# Patient Record
Sex: Female | Born: 1970 | Race: White | Hispanic: No | Marital: Married | Smoking: Never smoker
Health system: Southern US, Community
[De-identification: ages and names within clinical notes are randomized; demographics above are authoritative.]

## PROBLEM LIST (undated history)

## (undated) DIAGNOSIS — Z973 Presence of spectacles and contact lenses: Secondary | ICD-10-CM

## (undated) DIAGNOSIS — N920 Excessive and frequent menstruation with regular cycle: Secondary | ICD-10-CM

## (undated) DIAGNOSIS — J302 Other seasonal allergic rhinitis: Secondary | ICD-10-CM

## (undated) DIAGNOSIS — Z8669 Personal history of other diseases of the nervous system and sense organs: Secondary | ICD-10-CM

## (undated) DIAGNOSIS — Z9889 Other specified postprocedural states: Secondary | ICD-10-CM

## (undated) DIAGNOSIS — M542 Cervicalgia: Secondary | ICD-10-CM

## (undated) DIAGNOSIS — E559 Vitamin D deficiency, unspecified: Secondary | ICD-10-CM

## (undated) DIAGNOSIS — K219 Gastro-esophageal reflux disease without esophagitis: Secondary | ICD-10-CM

## (undated) DIAGNOSIS — T4145XA Adverse effect of unspecified anesthetic, initial encounter: Secondary | ICD-10-CM

## (undated) DIAGNOSIS — R609 Edema, unspecified: Secondary | ICD-10-CM

## (undated) DIAGNOSIS — F909 Attention-deficit hyperactivity disorder, unspecified type: Secondary | ICD-10-CM

## (undated) DIAGNOSIS — R112 Nausea with vomiting, unspecified: Secondary | ICD-10-CM

## (undated) DIAGNOSIS — F32A Depression, unspecified: Secondary | ICD-10-CM

## (undated) DIAGNOSIS — G8929 Other chronic pain: Secondary | ICD-10-CM

## (undated) DIAGNOSIS — F329 Major depressive disorder, single episode, unspecified: Secondary | ICD-10-CM

## (undated) DIAGNOSIS — F419 Anxiety disorder, unspecified: Secondary | ICD-10-CM

## (undated) DIAGNOSIS — T8859XA Other complications of anesthesia, initial encounter: Secondary | ICD-10-CM

## (undated) HISTORY — PX: DIAGNOSTIC LAPAROSCOPY: SUR761

---

## 1997-12-15 ENCOUNTER — Other Ambulatory Visit: Admission: RE | Admit: 1997-12-15 | Discharge: 1997-12-15 | Payer: Self-pay | Admitting: Obstetrics & Gynecology

## 1998-03-27 ENCOUNTER — Inpatient Hospital Stay (HOSPITAL_COMMUNITY): Admission: AD | Admit: 1998-03-27 | Discharge: 1998-03-27 | Payer: Self-pay | Admitting: Gynecology

## 2000-01-23 ENCOUNTER — Other Ambulatory Visit: Admission: RE | Admit: 2000-01-23 | Discharge: 2000-01-23 | Payer: Self-pay | Admitting: Gynecology

## 2000-10-29 ENCOUNTER — Other Ambulatory Visit: Admission: RE | Admit: 2000-10-29 | Discharge: 2000-10-29 | Payer: Self-pay | Admitting: Obstetrics & Gynecology

## 2001-01-19 ENCOUNTER — Inpatient Hospital Stay (HOSPITAL_COMMUNITY): Admission: AD | Admit: 2001-01-19 | Discharge: 2001-01-26 | Payer: Self-pay | Admitting: Obstetrics and Gynecology

## 2001-01-20 ENCOUNTER — Encounter: Payer: Self-pay | Admitting: Obstetrics and Gynecology

## 2001-02-05 ENCOUNTER — Inpatient Hospital Stay (HOSPITAL_COMMUNITY): Admission: AD | Admit: 2001-02-05 | Discharge: 2001-02-08 | Payer: Self-pay | Admitting: Family Medicine

## 2001-02-08 ENCOUNTER — Encounter: Payer: Self-pay | Admitting: Obstetrics & Gynecology

## 2001-03-15 ENCOUNTER — Inpatient Hospital Stay (HOSPITAL_COMMUNITY): Admission: AD | Admit: 2001-03-15 | Discharge: 2001-03-19 | Payer: Self-pay | Admitting: Obstetrics and Gynecology

## 2001-03-16 ENCOUNTER — Encounter: Payer: Self-pay | Admitting: Obstetrics and Gynecology

## 2001-04-15 ENCOUNTER — Other Ambulatory Visit: Admission: RE | Admit: 2001-04-15 | Discharge: 2001-04-15 | Payer: Self-pay | Admitting: Obstetrics and Gynecology

## 2002-05-06 ENCOUNTER — Other Ambulatory Visit: Admission: RE | Admit: 2002-05-06 | Discharge: 2002-05-06 | Payer: Self-pay | Admitting: Obstetrics and Gynecology

## 2002-10-19 ENCOUNTER — Other Ambulatory Visit: Admission: RE | Admit: 2002-10-19 | Discharge: 2002-10-19 | Payer: Self-pay | Admitting: Obstetrics and Gynecology

## 2003-11-18 ENCOUNTER — Other Ambulatory Visit: Admission: RE | Admit: 2003-11-18 | Discharge: 2003-11-18 | Payer: Self-pay | Admitting: Obstetrics and Gynecology

## 2004-12-01 ENCOUNTER — Other Ambulatory Visit: Admission: RE | Admit: 2004-12-01 | Discharge: 2004-12-01 | Payer: Self-pay | Admitting: Obstetrics and Gynecology

## 2006-01-08 ENCOUNTER — Other Ambulatory Visit: Admission: RE | Admit: 2006-01-08 | Discharge: 2006-01-08 | Payer: Self-pay | Admitting: Obstetrics and Gynecology

## 2007-02-11 ENCOUNTER — Emergency Department (HOSPITAL_COMMUNITY): Admission: EM | Admit: 2007-02-11 | Discharge: 2007-02-11 | Payer: Self-pay | Admitting: Emergency Medicine

## 2012-07-08 ENCOUNTER — Other Ambulatory Visit: Payer: Self-pay | Admitting: Obstetrics and Gynecology

## 2012-07-08 DIAGNOSIS — R928 Other abnormal and inconclusive findings on diagnostic imaging of breast: Secondary | ICD-10-CM

## 2012-07-11 ENCOUNTER — Ambulatory Visit
Admission: RE | Admit: 2012-07-11 | Discharge: 2012-07-11 | Disposition: A | Discharge status: Home / Self Care | Payer: 59 | Source: Ambulatory Visit | Attending: Obstetrics and Gynecology | Admitting: Obstetrics and Gynecology

## 2012-07-11 DIAGNOSIS — R928 Other abnormal and inconclusive findings on diagnostic imaging of breast: Secondary | ICD-10-CM

## 2013-01-26 DIAGNOSIS — M47812 Spondylosis without myelopathy or radiculopathy, cervical region: Principal | ICD-10-CM | POA: Diagnosis present

## 2013-01-27 ENCOUNTER — Encounter (HOSPITAL_COMMUNITY): Payer: Self-pay | Admitting: Pharmacy Technician

## 2013-01-29 ENCOUNTER — Encounter (HOSPITAL_COMMUNITY)
Admission: RE | Admit: 2013-01-29 | Discharge: 2013-01-29 | Disposition: A | Discharge status: Home / Self Care | Payer: 59 | Source: Ambulatory Visit | Attending: Orthopedic Surgery | Admitting: Orthopedic Surgery

## 2013-01-29 ENCOUNTER — Encounter (HOSPITAL_COMMUNITY): Payer: Self-pay

## 2013-01-29 DIAGNOSIS — M25519 Pain in unspecified shoulder: Secondary | ICD-10-CM | POA: Insufficient documentation

## 2013-01-29 DIAGNOSIS — M79609 Pain in unspecified limb: Secondary | ICD-10-CM | POA: Insufficient documentation

## 2013-01-29 DIAGNOSIS — R609 Edema, unspecified: Secondary | ICD-10-CM | POA: Insufficient documentation

## 2013-01-29 DIAGNOSIS — M5380 Other specified dorsopathies, site unspecified: Secondary | ICD-10-CM | POA: Insufficient documentation

## 2013-01-29 DIAGNOSIS — Z01818 Encounter for other preprocedural examination: Secondary | ICD-10-CM | POA: Insufficient documentation

## 2013-01-29 DIAGNOSIS — Z01812 Encounter for preprocedural laboratory examination: Secondary | ICD-10-CM | POA: Insufficient documentation

## 2013-01-29 HISTORY — DX: Anxiety disorder, unspecified: F41.9

## 2013-01-29 HISTORY — DX: Gastro-esophageal reflux disease without esophagitis: K21.9

## 2013-01-29 HISTORY — DX: Adverse effect of unspecified anesthetic, initial encounter: T41.45XA

## 2013-01-29 HISTORY — DX: Nausea with vomiting, unspecified: R11.2

## 2013-01-29 HISTORY — DX: Other specified postprocedural states: Z98.890

## 2013-01-29 HISTORY — DX: Other complications of anesthesia, initial encounter: T88.59XA

## 2013-01-29 LAB — BASIC METABOLIC PANEL
CO2: 29 mEq/L (ref 19–32)
Calcium: 9.3 mg/dL (ref 8.4–10.5)
Potassium: 4 mEq/L (ref 3.5–5.1)
Sodium: 137 mEq/L (ref 135–145)

## 2013-01-29 LAB — CBC
HCT: 36.7 % (ref 36.0–46.0)
Hemoglobin: 12.8 g/dL (ref 12.0–15.0)
MCHC: 34.9 g/dL (ref 30.0–36.0)
RBC: 4.2 MIL/uL (ref 3.87–5.11)
WBC: 6 10*3/uL (ref 4.0–10.5)

## 2013-01-29 LAB — SURGICAL PCR SCREEN
MRSA, PCR: NEGATIVE
Staphylococcus aureus: NEGATIVE

## 2013-01-29 NOTE — Pre-Procedure Instructions (Addendum)
[name]  01/29/2013   Your procedure is scheduled on:  02/04/2013  Report to [facility] at 9:30 AM.  Call this number if you have problems the morning of surgery: [phone]   Remember:  BRING BACK BRACE   Do not eat food or drink liquids after midnight.  TUESDAY   Take these medicines the morning of surgery with A SIP OF WATER:  ALLEGRA, may take pain med. Morning of surgery if needed    Do not wear jewelry, make-up or nail polish.  Do not wear lotions, powders, or perfumes. You may wear deodorant.  Do not shave 48 hours prior to surgery.   Do not bring valuables to the hospital.  Contacts, dentures or bridgework may not be worn into surgery.  Leave suitcase in the car. After surgery it may be brought to your room.  For patients admitted to the hospital, checkout time is 11:00 AM the day of  discharge.   Patients discharged the day of surgery will not be allowed to drive  home.  Name and phone number of your driver: /w spouse   Special Instructions: Shower using CHG 2 nights before surgery and the night before surgery.  If you shower the day of surgery use CHG.  Use special wash - you have one bottle of CHG for all showers.  You should use approximately 1/3 of the bottle for each shower.   Please read over the following fact sheets that you were given: Pain Booklet, Coughing and Deep Breathing, MRSA Information and Surgical Site Infection Prevention

## 2013-02-03 MED ORDER — CEFAZOLIN SODIUM-DEXTROSE 2-3 GM-% IV SOLR
2.0000 g | INTRAVENOUS | Status: AC
  Administered 2013-02-04: 2 g via INTRAVENOUS
  Filled 2013-02-03: qty 50

## 2013-02-04 ENCOUNTER — Observation Stay (HOSPITAL_COMMUNITY)
Admission: RE | Admit: 2013-02-04 | Discharge: 2013-02-05 | Disposition: A | Discharge status: Home / Self Care | Payer: 59 | Source: Ambulatory Visit | Attending: Orthopedic Surgery | Admitting: Orthopedic Surgery

## 2013-02-04 ENCOUNTER — Observation Stay (HOSPITAL_COMMUNITY): Payer: 59

## 2013-02-04 ENCOUNTER — Encounter (HOSPITAL_COMMUNITY)
Admission: RE | Disposition: A | Discharge status: Home / Self Care | Payer: Self-pay | Source: Ambulatory Visit | Attending: Orthopedic Surgery

## 2013-02-04 ENCOUNTER — Ambulatory Visit (HOSPITAL_COMMUNITY): Payer: 59 | Admitting: Anesthesiology

## 2013-02-04 ENCOUNTER — Encounter (HOSPITAL_COMMUNITY): Payer: Self-pay | Admitting: *Deleted

## 2013-02-04 ENCOUNTER — Encounter (HOSPITAL_COMMUNITY): Payer: Self-pay | Admitting: Anesthesiology

## 2013-02-04 DIAGNOSIS — M5020 Other cervical disc displacement, unspecified cervical region: Secondary | ICD-10-CM | POA: Insufficient documentation

## 2013-02-04 DIAGNOSIS — M47812 Spondylosis without myelopathy or radiculopathy, cervical region: Principal | ICD-10-CM | POA: Diagnosis present

## 2013-02-04 HISTORY — DX: Other seasonal allergic rhinitis: J30.2

## 2013-02-04 HISTORY — PX: ANTERIOR CERVICAL DECOMP/DISCECTOMY FUSION: SHX1161

## 2013-02-04 LAB — HCG, SERUM, QUALITATIVE: Preg, Serum: NEGATIVE

## 2013-02-04 SURGERY — ANTERIOR CERVICAL DECOMPRESSION/DISCECTOMY FUSION 3 LEVELS
Anesthesia: General | Site: Spine Cervical | Wound class: Clean

## 2013-02-04 MED ORDER — LIDOCAINE HCL (CARDIAC) 20 MG/ML IV SOLN
INTRAVENOUS | Status: DC | PRN
  Administered 2013-02-04: 80 mg via INTRAVENOUS

## 2013-02-04 MED ORDER — ROCURONIUM BROMIDE 100 MG/10ML IV SOLN
INTRAVENOUS | Status: DC | PRN
  Administered 2013-02-04: 50 mg via INTRAVENOUS

## 2013-02-04 MED ORDER — PHENOL 1.4 % MT LIQD
1.0000 | OROMUCOSAL | Status: DC | PRN
  Administered 2013-02-04 – 2013-02-05 (×2): 1 via OROMUCOSAL
  Filled 2013-02-04: qty 177

## 2013-02-04 MED ORDER — SODIUM CHLORIDE 0.9 % IJ SOLN: 3.0000 mL | INTRAMUSCULAR | Status: DC | PRN

## 2013-02-04 MED ORDER — LACTATED RINGERS IV SOLN
INTRAVENOUS | Status: DC
  Administered 2013-02-04: 11:00:00 via INTRAVENOUS

## 2013-02-04 MED ORDER — SODIUM CHLORIDE 0.9 % IV SOLN: 250.0000 mL | INTRAVENOUS | Status: DC

## 2013-02-04 MED ORDER — VECURONIUM BROMIDE 10 MG IV SOLR
INTRAVENOUS | Status: DC | PRN
  Administered 2013-02-04: 4 mg via INTRAVENOUS
  Administered 2013-02-04 (×3): 2 mg via INTRAVENOUS

## 2013-02-04 MED ORDER — ACETAMINOPHEN 10 MG/ML IV SOLN
INTRAVENOUS | Status: AC
  Filled 2013-02-04: qty 100

## 2013-02-04 MED ORDER — 0.9 % SODIUM CHLORIDE (POUR BTL) OPTIME
TOPICAL | Status: DC | PRN
  Administered 2013-02-04: 1000 mL

## 2013-02-04 MED ORDER — ONDANSETRON HCL 4 MG/2ML IJ SOLN
4.0000 mg | INTRAMUSCULAR | Status: DC | PRN
  Administered 2013-02-04 – 2013-02-05 (×4): 4 mg via INTRAVENOUS
  Filled 2013-02-04 (×4): qty 2

## 2013-02-04 MED ORDER — HYDROMORPHONE HCL PF 1 MG/ML IJ SOLN
INTRAMUSCULAR | Status: AC
  Filled 2013-02-04: qty 1

## 2013-02-04 MED ORDER — OXYCODONE HCL 5 MG PO TABS
ORAL_TABLET | ORAL | Status: AC
  Filled 2013-02-04: qty 2

## 2013-02-04 MED ORDER — CEFAZOLIN SODIUM 1-5 GM-% IV SOLN
1.0000 g | Freq: Three times a day (TID) | INTRAVENOUS | Status: AC
  Administered 2013-02-04 – 2013-02-05 (×2): 1 g via INTRAVENOUS
  Filled 2013-02-04 (×2): qty 50

## 2013-02-04 MED ORDER — DEXAMETHASONE SODIUM PHOSPHATE 4 MG/ML IJ SOLN
4.0000 mg | Freq: Four times a day (QID) | INTRAMUSCULAR | Status: DC
  Administered 2013-02-04 – 2013-02-05 (×2): 4 mg via INTRAVENOUS
  Filled 2013-02-04 (×7): qty 1

## 2013-02-04 MED ORDER — GLYCOPYRROLATE 0.2 MG/ML IJ SOLN
INTRAMUSCULAR | Status: DC | PRN
  Administered 2013-02-04: .4 mg via INTRAVENOUS

## 2013-02-04 MED ORDER — MEPERIDINE HCL 25 MG/ML IJ SOLN: 6.2500 mg | INTRAMUSCULAR | Status: DC | PRN

## 2013-02-04 MED ORDER — OXYCODONE HCL 5 MG PO TABS: 5.0000 mg | ORAL_TABLET | Freq: Once | ORAL | Status: DC | PRN

## 2013-02-04 MED ORDER — OXYCODONE HCL 5 MG PO TABS
10.0000 mg | ORAL_TABLET | ORAL | Status: DC | PRN
  Administered 2013-02-04 – 2013-02-05 (×3): 10 mg via ORAL
  Filled 2013-02-04 (×2): qty 2

## 2013-02-04 MED ORDER — THROMBIN 20000 UNITS EX SOLR
CUTANEOUS | Status: AC
  Filled 2013-02-04: qty 20000

## 2013-02-04 MED ORDER — LACTATED RINGERS IV SOLN
INTRAVENOUS | Status: DC | PRN
  Administered 2013-02-04 (×3): via INTRAVENOUS

## 2013-02-04 MED ORDER — DEXAMETHASONE SODIUM PHOSPHATE 4 MG/ML IJ SOLN
INTRAMUSCULAR | Status: DC | PRN
  Administered 2013-02-04: 8 mg via INTRAVENOUS

## 2013-02-04 MED ORDER — ACETAMINOPHEN 10 MG/ML IV SOLN
1000.0000 mg | Freq: Once | INTRAVENOUS | Status: AC
  Administered 2013-02-04: 1000 mg via INTRAVENOUS
  Filled 2013-02-04 (×3): qty 100

## 2013-02-04 MED ORDER — PROMETHAZINE HCL 25 MG/ML IJ SOLN: 6.2500 mg | INTRAMUSCULAR | Status: DC | PRN

## 2013-02-04 MED ORDER — MIDAZOLAM HCL 5 MG/5ML IJ SOLN
INTRAMUSCULAR | Status: DC | PRN
  Administered 2013-02-04: 2 mg via INTRAVENOUS

## 2013-02-04 MED ORDER — METHOCARBAMOL 500 MG PO TABS
500.0000 mg | ORAL_TABLET | Freq: Four times a day (QID) | ORAL | Status: DC | PRN
  Administered 2013-02-04 – 2013-02-05 (×2): 500 mg via ORAL
  Filled 2013-02-04: qty 1

## 2013-02-04 MED ORDER — MORPHINE SULFATE 2 MG/ML IJ SOLN
1.0000 mg | INTRAMUSCULAR | Status: DC | PRN
  Administered 2013-02-04: 2 mg via INTRAVENOUS
  Administered 2013-02-04: 4 mg via INTRAVENOUS
  Filled 2013-02-04: qty 1
  Filled 2013-02-04: qty 2

## 2013-02-04 MED ORDER — HEMOSTATIC AGENTS (NO CHARGE) OPTIME
TOPICAL | Status: DC | PRN
  Administered 2013-02-04: 1 via TOPICAL

## 2013-02-04 MED ORDER — MENTHOL 3 MG MT LOZG
1.0000 | LOZENGE | OROMUCOSAL | Status: DC | PRN
  Filled 2013-02-04: qty 9

## 2013-02-04 MED ORDER — DEXAMETHASONE SODIUM PHOSPHATE 4 MG/ML IJ SOLN
4.0000 mg | Freq: Once | INTRAMUSCULAR | Status: DC
  Filled 2013-02-04: qty 1

## 2013-02-04 MED ORDER — ACETAMINOPHEN 10 MG/ML IV SOLN
1000.0000 mg | Freq: Four times a day (QID) | INTRAVENOUS | Status: DC
  Administered 2013-02-04: 1000 mg via INTRAVENOUS
  Filled 2013-02-04 (×4): qty 100

## 2013-02-04 MED ORDER — ZOLPIDEM TARTRATE 5 MG PO TABS: 5.0000 mg | ORAL_TABLET | Freq: Every evening | ORAL | Status: DC | PRN

## 2013-02-04 MED ORDER — BUPIVACAINE-EPINEPHRINE PF 0.25-1:200000 % IJ SOLN
INTRAMUSCULAR | Status: AC
  Filled 2013-02-04: qty 30

## 2013-02-04 MED ORDER — ALPRAZOLAM 0.5 MG PO TABS
1.0000 mg | ORAL_TABLET | Freq: Every evening | ORAL | Status: DC | PRN
  Administered 2013-02-04: 1 mg via ORAL
  Filled 2013-02-04: qty 2

## 2013-02-04 MED ORDER — DEXTROSE 5 % IV SOLN
INTRAVENOUS | Status: DC | PRN
  Administered 2013-02-04: 11:00:00 via INTRAVENOUS

## 2013-02-04 MED ORDER — DULOXETINE HCL 60 MG PO CPEP
60.0000 mg | ORAL_CAPSULE | Freq: Every day | ORAL | Status: DC
  Administered 2013-02-05: 60 mg via ORAL
  Filled 2013-02-04 (×2): qty 1

## 2013-02-04 MED ORDER — LACTATED RINGERS IV SOLN
INTRAVENOUS | Status: DC
  Administered 2013-02-05: 1000 mL via INTRAVENOUS

## 2013-02-04 MED ORDER — EPHEDRINE SULFATE 50 MG/ML IJ SOLN
INTRAMUSCULAR | Status: DC | PRN
  Administered 2013-02-04: 5 mg via INTRAVENOUS

## 2013-02-04 MED ORDER — ONDANSETRON HCL 4 MG/2ML IJ SOLN
INTRAMUSCULAR | Status: DC | PRN
  Administered 2013-02-04 (×2): 4 mg via INTRAVENOUS

## 2013-02-04 MED ORDER — DEXAMETHASONE SODIUM PHOSPHATE 4 MG/ML IJ SOLN
4.0000 mg | Freq: Once | INTRAMUSCULAR | Status: DC
  Filled 2013-02-04 (×2): qty 1

## 2013-02-04 MED ORDER — THROMBIN 20000 UNITS EX SOLR
CUTANEOUS | Status: DC | PRN
  Administered 2013-02-04: 14:00:00 via TOPICAL

## 2013-02-04 MED ORDER — FENTANYL CITRATE 0.05 MG/ML IJ SOLN
INTRAMUSCULAR | Status: DC | PRN
  Administered 2013-02-04 (×3): 50 ug via INTRAVENOUS
  Administered 2013-02-04 (×2): 100 ug via INTRAVENOUS
  Administered 2013-02-04 (×2): 50 ug via INTRAVENOUS

## 2013-02-04 MED ORDER — BUPIVACAINE-EPINEPHRINE 0.25% -1:200000 IJ SOLN
INTRAMUSCULAR | Status: DC | PRN
  Administered 2013-02-04: 6 mL

## 2013-02-04 MED ORDER — NEOSTIGMINE METHYLSULFATE 1 MG/ML IJ SOLN
INTRAMUSCULAR | Status: DC | PRN
  Administered 2013-02-04: 3 mg via INTRAVENOUS

## 2013-02-04 MED ORDER — OXYCODONE HCL 5 MG/5ML PO SOLN: 5.0000 mg | Freq: Once | ORAL | Status: DC | PRN

## 2013-02-04 MED ORDER — METHOCARBAMOL 100 MG/ML IJ SOLN
500.0000 mg | Freq: Four times a day (QID) | INTRAVENOUS | Status: DC | PRN
  Filled 2013-02-04: qty 5

## 2013-02-04 MED ORDER — SODIUM CHLORIDE 0.9 % IJ SOLN
3.0000 mL | Freq: Two times a day (BID) | INTRAMUSCULAR | Status: DC
  Administered 2013-02-05: 3 mL via INTRAVENOUS

## 2013-02-04 MED ORDER — ARTIFICIAL TEARS OP OINT
TOPICAL_OINTMENT | OPHTHALMIC | Status: DC | PRN
  Administered 2013-02-04: 1 via OPHTHALMIC

## 2013-02-04 MED ORDER — HYDROMORPHONE HCL PF 1 MG/ML IJ SOLN
0.2500 mg | INTRAMUSCULAR | Status: DC | PRN
  Administered 2013-02-04: 0.5 mg via INTRAVENOUS
  Administered 2013-02-04: 1 mg via INTRAVENOUS

## 2013-02-04 MED ORDER — METOCLOPRAMIDE HCL 5 MG/ML IJ SOLN
INTRAMUSCULAR | Status: DC | PRN
  Administered 2013-02-04: 10 mg via INTRAVENOUS

## 2013-02-04 MED ORDER — PROPOFOL 10 MG/ML IV BOLUS
INTRAVENOUS | Status: DC | PRN
  Administered 2013-02-04: 200 mg via INTRAVENOUS

## 2013-02-04 MED ORDER — DEXAMETHASONE 4 MG PO TABS
4.0000 mg | ORAL_TABLET | Freq: Four times a day (QID) | ORAL | Status: DC
  Administered 2013-02-05 (×2): 4 mg via ORAL
  Filled 2013-02-04 (×7): qty 1

## 2013-02-04 MED ORDER — METHOCARBAMOL 500 MG PO TABS
ORAL_TABLET | ORAL | Status: AC
  Filled 2013-02-04: qty 1

## 2013-02-04 SURGICAL SUPPLY — 66 items
BLADE SURG ROTATE 9660 (MISCELLANEOUS) IMPLANT
BUR EGG ELITE 4.0 (BURR) IMPLANT
BUR MATCHSTICK NEURO 3.0 LAGG (BURR) IMPLANT
CANISTER SUCTION 2500CC (MISCELLANEOUS) ×2 IMPLANT
CLOTH BEACON ORANGE TIMEOUT ST (SAFETY) ×2 IMPLANT
CLSR STERI-STRIP ANTIMIC 1/2X4 (GAUZE/BANDAGES/DRESSINGS) ×2 IMPLANT
CORDS BIPOLAR (ELECTRODE) ×2 IMPLANT
COVER SURGICAL LIGHT HANDLE (MISCELLANEOUS) ×4 IMPLANT
CRADLE DONUT ADULT HEAD (MISCELLANEOUS) ×2 IMPLANT
DEVICE ENDSKLTN MED 6 7MM (Orthopedic Implant) IMPLANT
DEVICE ENDSKLTN TC MED 8MM (Orthopedic Implant) IMPLANT
DRAPE C-ARM 42X72 X-RAY (DRAPES) ×2 IMPLANT
DRAPE POUCH INSTRU U-SHP 10X18 (DRAPES) ×2 IMPLANT
DRAPE SURG 17X23 STRL (DRAPES) ×2 IMPLANT
DRAPE U-SHAPE 47X51 STRL (DRAPES) ×2 IMPLANT
DRSG MEPILEX BORDER 4X4 (GAUZE/BANDAGES/DRESSINGS) ×1 IMPLANT
DRSG MEPILEX BORDER 4X8 (GAUZE/BANDAGES/DRESSINGS) ×2 IMPLANT
DURAPREP 26ML APPLICATOR (WOUND CARE) ×2 IMPLANT
ELECT COATED BLADE 2.86 ST (ELECTRODE) ×2 IMPLANT
ELECT REM PT RETURN 9FT ADLT (ELECTROSURGICAL) ×2
ELECTRODE REM PT RTRN 9FT ADLT (ELECTROSURGICAL) ×1 IMPLANT
ENDOSKELETON MED 6 7MM (Orthopedic Implant) ×2 IMPLANT
ENDOSKELTON TC IMPLANT 8MM MED (Orthopedic Implant) ×2 IMPLANT
GLOVE BIOGEL PI IND STRL 8.5 (GLOVE) ×1 IMPLANT
GLOVE BIOGEL PI INDICATOR 8.5 (GLOVE) ×1
GLOVE ECLIPSE 8.5 STRL (GLOVE) ×2 IMPLANT
GOWN PREVENTION PLUS XXLARGE (GOWN DISPOSABLE) ×2 IMPLANT
GOWN STRL REIN XL XLG (GOWN DISPOSABLE) ×4 IMPLANT
INTERLOCK LRDTC CRVCL VBR 7MM (Bone Implant) IMPLANT
KIT BASIN OR (CUSTOM PROCEDURE TRAY) ×2 IMPLANT
KIT ROOM TURNOVER OR (KITS) ×2 IMPLANT
LORDOTIC CERVICAL VBR 7MM SM (Bone Implant) ×2 IMPLANT
MIX DBX 10CC 35% BONE (Bone Implant) ×1 IMPLANT
NDL SPNL 18GX3.5 QUINCKE PK (NEEDLE) ×1 IMPLANT
NEEDLE SPNL 18GX3.5 QUINCKE PK (NEEDLE) ×2 IMPLANT
NS IRRIG 1000ML POUR BTL (IV SOLUTION) ×3 IMPLANT
PACK ORTHO CERVICAL (CUSTOM PROCEDURE TRAY) ×2 IMPLANT
PACK UNIVERSAL I (CUSTOM PROCEDURE TRAY) ×2 IMPLANT
PAD ARMBOARD 7.5X6 YLW CONV (MISCELLANEOUS) ×6 IMPLANT
PATTIES SURGICAL .25X.25 (GAUZE/BANDAGES/DRESSINGS) ×1 IMPLANT
PATTIES SURGICAL .5 X.5 (GAUZE/BANDAGES/DRESSINGS) IMPLANT
PIN DISTRACTION 14 (PIN) ×2 IMPLANT
PLATE SWIFT 3LVL 51MM SPINE (Plate) ×1 IMPLANT
RESTRAINT LIMB HOLDER UNIV (RESTRAINTS) ×2 IMPLANT
SCREW SD-VA 14M SWIFT PLUS (Screw) ×2 IMPLANT
SCREW SWIFT SD-VA 12MM (Screw) ×6 IMPLANT
SCREW SWIFT SD-VA RESCUE 12MM (Screw) ×1 IMPLANT
SPONGE INTESTINAL PEANUT (DISPOSABLE) ×4 IMPLANT
SPONGE LAP 4X18 X RAY DECT (DISPOSABLE) ×2 IMPLANT
SPONGE SURGIFOAM ABS GEL 100 (HEMOSTASIS) ×2 IMPLANT
SURGIFLO TRUKIT (HEMOSTASIS) IMPLANT
SUT MNCRL AB 3-0 PS2 18 (SUTURE) ×2 IMPLANT
SUT SILK 2 0 (SUTURE) ×2
SUT SILK 2-0 18XBRD TIE 12 (SUTURE) ×1 IMPLANT
SUT VIC AB 2-0 CT1 18 (SUTURE) ×2 IMPLANT
SWIFT 12MM DRILL ×1 IMPLANT
SWIFT 14MM DRILL ×1 IMPLANT
SYR BULB IRRIGATION 50ML (SYRINGE) ×2 IMPLANT
SYR CONTROL 10ML LL (SYRINGE) ×2 IMPLANT
TAPE CLOTH 4X10 WHT NS (GAUZE/BANDAGES/DRESSINGS) ×2 IMPLANT
TAPE UMBILICAL COTTON 1/8X30 (MISCELLANEOUS) ×2 IMPLANT
TOWEL OR 17X24 6PK STRL BLUE (TOWEL DISPOSABLE) ×2 IMPLANT
TOWEL OR 17X26 10 PK STRL BLUE (TOWEL DISPOSABLE) ×2 IMPLANT
TRAY FOLEY CATH 14FR (SET/KITS/TRAYS/PACK) ×2 IMPLANT
WATER STERILE IRR 1000ML POUR (IV SOLUTION) ×1 IMPLANT
swift 14mm sd-va rescue screw (Screw) ×1 IMPLANT

## 2013-02-04 NOTE — Anesthesia Preprocedure Evaluation (Addendum)
Anesthesia Evaluation  Patient identified by MRN, date of birth, ID band Patient awake    Reviewed: Allergy & Precautions, H&P , NPO status , Patient's Chart, lab work & pertinent test results  Airway Mallampati: II  Neck ROM: Full    Dental  (+) Teeth Intact   Pulmonary  breath sounds clear to auscultation        Cardiovascular Rhythm:Regular Rate:Normal     Neuro/Psych Seizures -,     GI/Hepatic GERD-  ,  Endo/Other    Renal/GU      Musculoskeletal   Abdominal   Peds  Hematology   Anesthesia Other Findings   Reproductive/Obstetrics                          Anesthesia Physical Anesthesia Plan  ASA: II  Anesthesia Plan: General   Post-op Pain Management:    Induction: Intravenous  Airway Management Planned: Oral ETT  Additional Equipment:   Intra-op Plan:   Post-operative Plan: Extubation in OR  Informed Consent: I have reviewed the patients History and Physical, chart, labs and discussed the procedure including the risks, benefits and alternatives for the proposed anesthesia with the patient or authorized representative who has indicated his/her understanding and acceptance.   Dental advisory given  Plan Discussed with: CRNA and Surgeon  Anesthesia Plan Comments:         Anesthesia Quick Evaluation

## 2013-02-04 NOTE — H&P (Signed)
History of Present Illness The patient is a 42 year old female who presents today for follow up of their neck. The patient is being followed for their central neck pain. They are 11 week(s) (and 2 days) out from [facility]. Symptoms reported today include: pain. The patient feels that they are doing poorly.    Subjective Transcription  She returns today for follow up. Unfortunately the epidural injection only provided some relief for about two weeks. She continues to have severe neck pain radiating into both upper extremities but the right side has become more symptomatic especially over the deltoid and lateral aspect of the humerus. We have gone over her MRI again which does show three level disc degeneration and hard disc osteophyte formation, C4-5, C5-6, C6-7.    Allergies No Known Drug Allergies. 09/05/2012   Medication History Norco (10-325MG  Tablet, 1 (one) Tablet Oral four times daily, as needed, Taken starting 01/09/2013) Active. Cymbalta (60MG  Capsule DR Part, Oral) Active. Omeprazole (40MG  Capsule DR, Oral) Active. ALPRAZolam (0.5MG  Tablet, Oral) Active. Lunesta (3MG  Tablet, Oral) Active. Fexofenadine-Pseudoephed ER (60-120MG  Tablet ER 12HR, Oral) Active. Vitamin D (Ergocalciferol) (50000UNIT Capsule, Oral) Active.   Objective Transcription  At this point clinically she has neck pain with palpation and range of motion. No shortness of breath or chest pain. Lungs are clear to auscultation. Heart regular rate and rhythm. Abdomen is soft, nontender. Normal gait pattern. She has positive Lhermitte's sign with electrical pain shooting into both upper extremities. Negative Babinski, negative Hoffmann, 1+ symmetrical deep tendon reflexes throughout bilaterally in the upper extremities. Normal gait pattern. No real shoulder, elbow or wrist pain with joint range of motion. Clinically I do think she is having ongoing severe discogenic neck pain with radiculitis causing radicular  arm pain. Fortunately there are no focal motor deficits. She has had multiple course of physical therapy. She has had the epidural injection, various medications and her condition has only continued to deteriorate over the last three years.    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 At this point because of the multilevel nature of this problem I would recommend three level ACDF. My concern is if I just do the one or two levels that the remaining level since it is already structurally compromised it would have a higher than likely chance of developing symptomatic adjacent disease. We reviewed the risks of surgery which include infection, bleeding, nerve damage, death, stroke, paralysis, failure to heal, ongoing or worse pain, throat pain, swallowing difficulties, hoarseness in the voice, need for further surgery. All of her questions were encouraged and answered. We will plan on proceeding with surgery in the near future. Because this is a multilevel procedure I would like also to use an external bone stimulator following surgery to improve our overall fusion rate.

## 2013-02-04 NOTE — Anesthesia Procedure Notes (Signed)
Procedure Name: Intubation Date/Time: 02/04/2013 11:23 AM Performed by: Wray Kearns A Pre-anesthesia Checklist: Patient identified, Timeout performed, Emergency Drugs available, Suction available and Patient being monitored Patient Re-evaluated:Patient Re-evaluated prior to inductionOxygen Delivery Method: Circle system utilized Preoxygenation: Pre-oxygenation with 100% oxygen Intubation Type: IV induction and Cricoid Pressure applied Ventilation: Mask ventilation without difficulty Laryngoscope Size: Mac and 4 Grade View: Grade I Tube type: Oral Tube size: 7.5 mm Number of attempts: 1 Airway Equipment and Method: Stylet and LTA kit utilized Placement Confirmation: ETT inserted through vocal cords under direct vision and breath sounds checked- equal and bilateral Secured at: 22 cm Tube secured with: Tape Dental Injury: Teeth and Oropharynx as per pre-operative assessment

## 2013-02-04 NOTE — Transfer of Care (Signed)
Immediate Anesthesia Transfer of Care Note  Patient: [name]  Procedure(s) Performed: Procedure(s) with comments: ANTERIOR CERVICAL DECOMPRESSION/DISCECTOMY FUSION 3 LEVELS (N/A) - anterior cervical discectomy and fusion cervical four-five, five-six, six-seven   Patient Location: PACU  Anesthesia Type:General  Level of Consciousness: awake, alert , oriented and patient cooperative  Airway & Oxygen Therapy: Patient Spontanous Breathing and Patient connected to nasal cannula oxygen  Post-op Assessment: Report given to PACU RN, Post -op Vital signs reviewed and stable and Patient moving all extremities X 4  Post vital signs: Reviewed and stable  Complications: No apparent anesthesia complications

## 2013-02-04 NOTE — Brief Op Note (Signed)
02/04/2013  3:39 PM  PATIENT:  [name]  42 y.o. female  PRE-OPERATIVE DIAGNOSIS:  CERVICLE SPONDYLOSIS WITH RADICULAR ARM PAIN  POST-OPERATIVE DIAGNOSIS:  CERVICLE SPONDYLOSIS WITH RADICULAR ARM PAIN  PROCEDURE:  Procedure(s) with comments: ANTERIOR CERVICAL DECOMPRESSION/DISCECTOMY FUSION 3 LEVELS (N/A) - anterior cervical discectomy and fusion cervical four-five, five-six, six-seven   SURGEON:  Surgeon(s) and Role:    * Venita Lick, MD - Primary  PHYSICIAN ASSISTANT:   ASSISTANTS: none   ANESTHESIA:   general  EBL:  Total I/O In: 3000 [I.V.:3000] Out: 300 [Urine:250; Blood:50]  BLOOD ADMINISTERED:none  DRAINS: none   LOCAL MEDICATIONS USED:  MARCAINE     SPECIMEN:  No Specimen  DISPOSITION OF SPECIMEN:  N/A  COUNTS:  YES  TOURNIQUET:  * No tourniquets in log *  DICTATION: .Other Dictation: Dictation Number [id number]  PLAN OF CARE: Admit for overnight observation  PATIENT DISPOSITION:  PACU - hemodynamically stable.

## 2013-02-05 ENCOUNTER — Encounter (HOSPITAL_COMMUNITY): Payer: Self-pay | Admitting: General Practice

## 2013-02-05 MED ORDER — DIAZEPAM 5 MG PO TABS
10.0000 mg | ORAL_TABLET | Freq: Three times a day (TID) | ORAL | Status: DC | PRN
  Administered 2013-02-05: 10 mg via ORAL
  Filled 2013-02-05: qty 2

## 2013-02-05 MED ORDER — OXYCODONE HCL 15 MG PO TABS: 15.0000 mg | ORAL_TABLET | Freq: Four times a day (QID) | ORAL | Status: DC | PRN

## 2013-02-05 MED ORDER — LORATADINE 10 MG PO TABS
10.0000 mg | ORAL_TABLET | Freq: Every day | ORAL | Status: DC
  Administered 2013-02-05: 10 mg via ORAL
  Filled 2013-02-05: qty 1

## 2013-02-05 MED ORDER — DIAZEPAM 10 MG PO TABS: 10.0000 mg | ORAL_TABLET | Freq: Three times a day (TID) | ORAL | Status: DC | PRN

## 2013-02-05 MED ORDER — ONDANSETRON HCL 4 MG PO TABS: 4.0000 mg | ORAL_TABLET | Freq: Three times a day (TID) | ORAL | Status: DC | PRN

## 2013-02-05 MED ORDER — OXYCODONE HCL 5 MG PO TABS
15.0000 mg | ORAL_TABLET | ORAL | Status: DC | PRN
Start: 2013-02-05 — End: 2013-02-05
  Administered 2013-02-05: 15 mg via ORAL
  Filled 2013-02-05 (×2): qty 3

## 2013-02-05 MED ORDER — DOCUSATE SODIUM 100 MG PO CAPS: 100.0000 mg | ORAL_CAPSULE | Freq: Three times a day (TID) | ORAL | Status: DC | PRN

## 2013-02-05 MED ORDER — POLYETHYLENE GLYCOL 3350 17 GM/SCOOP PO POWD: 17.0000 g | Freq: Every day | ORAL | Status: DC

## 2013-02-05 NOTE — Evaluation (Signed)
Occupational Therapy Evaluation Patient Details Name: [name] MRN: [id number] DOB: [birth year] Today's Date: 02/05/2013 Time: 4098-1191 OT Time Calculation (min): 33 min  OT Assessment / Plan / Recommendation Clinical Impression    Pt. underwent ACDF 3 level and presents at supervision level for ADLs. She is to be Dc'd later today and does not have additional acute OT needs . Education completed. Has 24 hour assist for home Dc. Will sign off      OT Assessment  Patient does not need any further OT services    Follow Up Recommendations  No OT follow up    Barriers to Discharge      Equipment Recommendations  None recommended by OT    Recommendations for Other Services    Frequency       Precautions / Restrictions Precautions Precautions: Cervical Precaution Comments: handout priveded and pt. educated on precuations accordingly Required Braces or Orthoses: Cervical Brace Cervical Brace: Hard collar Restrictions Weight Bearing Restrictions: No   Pertinent Vitals/Pain Shoulder pain 4/10. Repositioned.     ADL  Upper Body Bathing: Supervision/safety Where Assessed - Upper Body Bathing: Unsupported standing Lower Body Bathing: Supervision/safety Where Assessed - Lower Body Bathing: Unsupported sitting Upper Body Dressing: Supervision/safety Where Assessed - Upper Body Dressing: Unsupported standing Lower Body Dressing: Supervision/safety Where Assessed - Lower Body Dressing: Unsupported sitting Toilet Transfer: Modified independent Toilet Transfer Method: Sit to stand Toilet Transfer Equipment: Comfort height toilet Toileting - Clothing Manipulation and Hygiene: Modified independent Where Assessed - Engineer, mining and Hygiene: Sit to stand from 3-in-1 or toilet Tub/Shower Transfer: Performed;Supervision/safety Tub/Shower Transfer Method: Ambulating Equipment Used: Other (comment) (cervical brace) Transfers/Ambulation Related to ADLs: Independent  with ambulation. ADL Comments: Pt at supervision level for ADLs to maintain precautions. Practiced tub transfer and recommended getting chair to sit on in tub.     OT Diagnosis:    OT Problem List:   OT Treatment Interventions:     OT Goals    Visit Information  Last OT Received On: 02/05/13 Assistance Needed: +1 PT/OT Co-Evaluation/Treatment: Yes    Subjective Data      Prior Functioning     Home Living Lives With: Spouse Available Help at Discharge: Family;Available 24 hours/day Type of Home: House Home Access: Stairs to enter [employer] of Steps: 3 Entrance Stairs-Rails: Right Home Layout: One level Bathroom Shower/Tub: Engineer, manufacturing systems: Handicapped height Home Adaptive Equipment: None Prior Function Level of Independence: Independent Able to Take Stairs?: Yes Driving: Yes Vocation: Part time employment Communication Communication: No difficulties Dominant Hand: Right         Vision/Perception     Cognition  Cognition Arousal/Alertness: Awake/alert Behavior During Therapy: WFL for tasks assessed/performed Overall Cognitive Status: Within Functional Limits for tasks assessed    Extremity/Trunk Assessment Right Upper Extremity Assessment RUE ROM/Strength/Tone: WFL for tasks assessed RUE Sensation: WFL - Light Touch (denies numbness or tingling) Left Upper Extremity Assessment LUE ROM/Strength/Tone: WFL for tasks assessed LUE Sensation: WFL - Light Touch (denies numbness or tingling)     Mobility Bed Mobility Bed Mobility: Not assessed Transfers Transfers: Sit to Stand;Stand to Sit Sit to Stand: 6: Modified independent (Device/Increase time);From bed Stand to Sit: 7: Independent;To chair/3-in-1 Details for Transfer Assistance: increased time for sit to stand      Exercise     Balance     End of Session OT - End of Session Equipment Utilized During Treatment: Cervical collar Activity Tolerance: Patient tolerated  treatment well Patient left: with family/visitor  present;in chair  GO Functional Assessment Tool Used: clinical judgment Functional Limitation: Self care Self Care Current Status (W0981): At least 1 percent but less than 20 percent impaired, limited or restricted  Self Care Discharge Status [phone]): At least 1 percent but less than 20 percent impaired, limited or restricted  Earlie Raveling OTR/L [phone] 02/05/2013, 12:00 PM

## 2013-02-05 NOTE — Evaluation (Signed)
Physical Therapy Evaluation Patient Details Name: [name] MRN: [id number] DOB: 1971-02-18 Today's Date: 02/05/2013 Time: 4098-1191 PT Time Calculation (min): 23 min  PT Assessment / Plan / Recommendation Clinical Impression  Pt. underwent ACDF 3 level and presents to PT with good mobility level post-operatively.  She is to be Dc'd later today and does not have additional acute PT needs . Education completed.  Has 24 hour assist for home Dc. Will sign off     PT Assessment  Patient needs continued PT services    Follow Up Recommendations  No PT follow up    Does the patient have the potential to tolerate intense rehabilitation      Barriers to Discharge None      Equipment Recommendations  None recommended by PT    Recommendations for Other Services     Frequency      Precautions / Restrictions Precautions Precautions: Cervical Precaution Comments: handout priveded and pt. educated on precuations accordingly Required Braces or Orthoses: Cervical Brace Cervical Brace: Hard collar Restrictions Weight Bearing Restrictions: No   Pertinent Vitals/Pain See vitals tab      Mobility  Bed Mobility Bed Mobility: Not assessed (pt. seated at EOB) Transfers Transfers: Sit to Stand;Stand to Sit Sit to Stand: 6: Modified independent (Device/Increase time) Stand to Sit: 7: Independent Details for Transfer Assistance: increased time for sit to stand  Ambulation/Gait Ambulation/Gait Assistance: 7: Independent Ambulation Distance (Feet): 150 Feet Assistive device: None Ambulation/Gait Assistance Details: no overt LOB noted, no device needed to steady pt. Gait Pattern: Within Functional Limits Stairs: Yes Stairs Assistance: 6: Modified independent (Device/Increase time) Stair Management Technique: One rail Right;Forwards Number of Stairs: 3    Exercises     PT Diagnosis:    PT Problem List: Pain PT Treatment Interventions:     PT Goals    Visit Information  Last PT Received On: 02/05/13 Assistance Needed: +1 PT/OT Co-Evaluation/Treatment: Yes    Subjective Data  Subjective: Pt. 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, Associate Professor   Prior Functioning  Home Living Lives With: Spouse Available Help at Discharge: Family;Available 24 hours/day Type of Home: House Home Access: Stairs to enter [employer] of Steps: 3 Entrance Stairs-Rails: Right Home Layout: One level Bathroom Shower/Tub: Engineer, manufacturing systems: Handicapped height Home Adaptive Equipment: None Prior Function Level of Independence: Independent Able to Take Stairs?: Yes Driving: Yes Vocation: Part time employment Communication Communication: No difficulties Dominant Hand: Right    Cognition  Cognition Arousal/Alertness: Awake/alert Behavior During Therapy: WFL for tasks assessed/performed Overall Cognitive Status: Within Functional Limits for tasks assessed    Extremity/Trunk Assessment Right Upper Extremity Assessment RUE ROM/Strength/Tone: WFL for tasks assessed RUE Sensation: WFL - Light Touch (denies numbness or tingling) Left Upper Extremity Assessment LUE ROM/Strength/Tone: WFL for tasks assessed LUE Sensation: WFL - Light Touch (deinies numbness or tingling) Right Lower Extremity Assessment RLE ROM/Strength/Tone: WFL for tasks assessed RLE Sensation: WFL - Light Touch RLE Coordination: WFL - gross/fine motor Left Lower Extremity Assessment LLE ROM/Strength/Tone: WFL for tasks assessed LLE Sensation: WFL - Light Touch LLE Coordination: WFL - gross/fine motor Trunk Assessment Trunk Assessment: Normal   Balance    End of Session PT - End of Session Equipment Utilized During Treatment: Gait belt;Cervical collar Activity Tolerance: Patient tolerated treatment well Patient left: Other (comment) (with PT to do dressing) Nurse Communication: Mobility status  GP Functional Assessment Tool  Used: clinical judgement Functional Limitation: Mobility: Walking  and moving around Mobility: Walking and Moving Around Current Status [phone]): At least 1 percent but less than 20 percent impaired, limited or restricted Mobility: Walking and Moving Around Goal Status [phone]): At least 1 percent but less than 20 percent impaired, limited or restricted Mobility: Walking and Moving Around Discharge Status [phone]): At least 1 percent but less than 20 percent impaired, limited or restricted   Ferman Hamming 02/05/2013, 11:37 AM Weldon Picking PT Acute Rehab Services [phone] Beeper [phone]

## 2013-02-05 NOTE — Progress Notes (Signed)
Patient discharged in stable condition via wheelchair home. Discharge instructions and prescriptions were given and explained. 

## 2013-02-05 NOTE — Op Note (Signed)
NAMEPARISH, Paige NO.:  [id number]  MEDICAL RECORD NO.:  [id number]  LOCATION:  [id number]                        FACILITY:  [facility]  PHYSICIAN:  Alvy Beal, MD    DATE OF BIRTH:  23-Apr-1971  DATE OF PROCEDURE:  02/04/2013 DATE OF DISCHARGE:                              OPERATIVE REPORT   PREOPERATIVE DIAGNOSIS:  Cervical spondylotic radiculopathy, C4-5, C5-6, C6-7.  POSTOPERATIVE DIAGNOSIS:  Cervical spondylotic radiculopathy, C4-5, C5- 6, C6-7.  OPERATIVE PROCEDURE:  Anterior cervical diskectomy and fusion, C4-C7.  COMPLICATIONS:  None.  HISTORY:  This is a very pleasant woman who presented with ongoing severe debilitating neck, scapular, and right radicular arm pain and intermittent left radicular arm pain.  The patient continued to have severe pain.  Despite conservative management consisting of injection therapy, physical therapy, medications.  As a result of the ongoing severe pain, she elected to proceed with surgery.  All appropriate risks, benefits, and alternatives were discussed with the patient and consent was obtained.  DESCRIPTION OF PROCEDURE:  The patient was brought to the operating room, placed supine on the operating table.  After successful induction of general anesthesia and endotracheal intubation, TEDs, SCDs, and Foley were inserted.  Towels were placed between the shoulder blades.  Straps were placed on the risk for contracture and anterior cervical spine was prepped and draped in a standard fashion.  Time-out was done confirming patient, procedure, and all other pertinent important data.  Once this was completed, I infiltrated the left longitudinal incisional site and then performed a standard Clementeen Graham approach to the cervical spine.  Longitudinal incision was made starting from the superior aspect of C4 and proceeding down to the inferior aspect.  Sharp dissection was carried out down through the platysma.  The  platysma was sharply incised and then I identified the sternocleidomastoid dissected along the lateral medial border of the sternocleidomastoid through the deep cervical fascia sweeping the trachea and esophagus to the right.  Once I was through the prevertebral fascia, I could palpate the anterior spine.  I placed retractors to protect the esophagus, identified the carotid sheath and protected that with my finger.  I mobilized the remaining prevertebral fascia to completely expose from the midbody of C4 to the midbody of C7, and needle was placed into the C4-5 disk space.  Intraoperative x-ray confirmed that I was at the appropriate level.  I then mobilized the paraspinal muscles from the midbody of C4 to the midbody of C7 bilaterally.  I then placed self-retaining retractors underneath the longus colli muscles and deflated the endotracheal cuff, expanded the retractor and placed distraction pins into the bodies of C6 and C7.  An annulotomy was performed with a 15 blade scalpel and then using a combination of pituitary rongeurs, curettes, and Kerrison rongeurs, I effectively removed all the disk material.  I scraped the cartilage off the endplate to expose subchondral bleeding bone.  I then used a nerve hook to develop a plane underneath the posterior anulus and posterior longitudinal ligament and then used an 1-mm Kerrison to resect the structures.  I was able to mobilize a central disk herniation at this level and removed it.  Once  I had an adequate decompression, I then resected some of the uncovertebral joint bone spur.  Once this was done, I rasped the endplates and then measured with trial devices and placed an 8 lordotic medium Titan titanium intervertebral cage.  This was packed with DBX mix.  I got excellent purchase with the graft.  I then removed the distraction pin from the body of C7, placed into the body of C5, released and repositioned the retractors, and exposed the C5-6  disk. Using the same technique, I had used at C6-7, I removed the C5-6 disk. I also removed the posterior anulus and posterior longitudinal ligament in a similar fashion.  Again, there was a central disk herniation which was eventually removed using a nerve hook.  I rasped the endplates and then measured and placed a size 7 medium lordotic cage at this level. Again it was packed with DBX mix.  I then took the distraction pin at the C6, placed into C4 and repositioned the self-retaining retractor. Using the same technique, I performed a complete diskectomy at C4-5. Once I had released the posterior longitudinal ligament and removed the central disk herniation, and removed the bone spur, I irrigated and rasped the endplates.  I placed a 7 small lordotic cage at this level packed with DBX mix.  I then irrigated the wound copiously with normal saline and made sure I had hemostasis.  The distraction pins were removed.  I measured a DePuy Skyline dynamic plate.  It was secured with 14 and 12-mm locking screws.  All screws had excellent purchase.  I then checked after securing the plate to the anterior cervical spine that the esophagus was not entrapped beneath it.  Once I confirmed the trachea and esophagus were free of the plate, I then removed the dynamic 1-mm spacer, so that the plate could compress.  I then irrigated copiously with normal saline, made sure I had hemostasis using bipolar electrocautery, and then returned the trachea and esophagus to midline. I closed the platysma with interrupted 2-0 Vicryl sutures and 3-0 Monocryl for the skin.  Steri-Strips and dry dressing, and Aspen collar were applied.  The patient was then extubated and transferred to PACU without incident.  At the end of the case, all needle and sponge counts were correct.  There were no adverse intraoperative events.     Alvy Beal, MD     DDB/MEDQ  D:  02/04/2013  T:  02/05/2013  Job:  [id number]

## 2013-02-05 NOTE — Progress Notes (Addendum)
    Subjective: Procedure(s) (LRB): ANTERIOR CERVICAL DECOMPRESSION/DISCECTOMY FUSION 3 LEVELS (N/A) 1 Day Post-Op  Patient reports pain as 6 on 0-10 scale.  Reports decreased arm pain reports incisional neck pain   Positive void Negative bowel movement Positive flatus Negative chest pain or shortness of breath  Objective: Vital signs in last 24 hours: Temp:  [97.5 F (36.4 C)-98.8 F (37.1 C)] 98 F (36.7 C) (04/24 0500) Pulse Rate:  [87-118] 105 (04/24 0500) Resp:  [12-27] 18 (04/24 0500) BP: (123-145)/(59-82) 140/77 mmHg (04/24 0500) SpO2:  [94 %-99 %] 97 % (04/24 0500) Weight:  [89.6 kg (197 lb 8.5 oz)] 89.6 kg (197 lb 8.5 oz) (04/23 1650)  Intake/Output from previous day: 04/23 0701 - 04/24 0700 In: 3100 [I.V.:3100] Out: 3000 [Urine:2950; Blood:50]  Labs: No results found for this basename: WBC, RBC, HCT, PLT,  in the last 72 hours No results found for this basename: NA, K, CL, CO2, BUN, CREATININE, GLUCOSE, CALCIUM,  in the last 72 hours No results found for this basename: LABPT, INR,  in the last 72 hours  Physical Exam: Neurologically intact Neurovascular intact Sensation intact distally Incision: dressing C/D/I and no drainage Compartment soft no swelling at incision site  Assessment/Plan: Patient stable  xrays satisfactory Mobilization with physical therapy Encourage incentive spirometry Continue care  Advance diet Up with therapy Plan for discharge tomorrow Patient with pain and spasms.   Alter medications: d/c robaxin and start valium.  Increase oxycodone to 15mg .  Possible d/c today if pain improved otherwise - d/c tomorrow.    Venita Lick, MD [facility] [phone]   Patient with improved pain control Cleared by PT [facility] for d/c today

## 2013-02-05 NOTE — Anesthesia Postprocedure Evaluation (Signed)
Anesthesia Post Note  Patient: [name]  Procedure(s) Performed: Procedure(s) (LRB): ANTERIOR CERVICAL DECOMPRESSION/DISCECTOMY FUSION 3 LEVELS (N/A)  Anesthesia type: general  Patient location: PACU  Post pain: Pain level controlled  Post assessment: Patient's Cardiovascular Status Stable  Last Vitals:  Filed Vitals:   02/05/13 0500  BP: 140/77  Pulse: 105  Temp: 36.7 C  Resp: 18    Post vital signs: Reviewed and stable  Level of consciousness: sedated  Complications: No apparent anesthesia complications

## 2013-02-05 NOTE — Progress Notes (Signed)
UR COMPLETED  

## 2013-02-06 NOTE — Care Management Note (Signed)
CARE MANAGEMENT NOTE 02/06/2013  Patient:  [name], [name]   Account Number:  [id number]  Date Initiated:  02/05/2013  Documentation initiated by:  Vance Peper  Subjective/Objective Assessment:   42 yr old female s/p 3 level ACDF     Action/Plan:   No home health needs identified.   Anticipated DC Date:  02/05/2013   Anticipated DC Plan:  HOME/SELF CARE      DC Planning Services  CM consult      Choice offered to / List presented to:             Status of service:  Completed, signed off Medicare Important Message given?   (If response is "NO", the following Medicare IM given date fields will be blank) Date Medicare IM given:   Date Additional Medicare IM given:    Discharge Disposition:  HOME/SELF CARE  Per UR Regulation:    If discussed at Long Length of Stay Meetings, dates discussed:    Comments:

## 2013-02-09 ENCOUNTER — Encounter (HOSPITAL_COMMUNITY): Payer: Self-pay | Admitting: Orthopedic Surgery

## 2013-02-10 NOTE — Discharge Summary (Signed)
Patient ID: [name] MRN: [id number] DOB/AGE: 03-28-1971 42 y.o.  Admit date: 02/04/2013 Discharge date: 02/10/2013  Admission Diagnoses:  Principal Problem:   Cervical spondylosis without myelopathy   Discharge Diagnoses:  Principal Problem:   Cervical spondylosis without myelopathy  status post Procedure(s): ANTERIOR CERVICAL DECOMPRESSION/DISCECTOMY FUSION 3 LEVELS  Past Medical History  Diagnosis Date  . Complication of anesthesia   . PONV (postoperative nausea and vomiting)   . Anxiety   . Mental disorder   . GERD (gastroesophageal reflux disease)   . 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   . Arthritis     degenerative spine- cervical   . Seasonal allergies     Surgeries: Procedure(s): ANTERIOR CERVICAL DECOMPRESSION/DISCECTOMY FUSION 3 LEVELS on 02/04/2013   Consultants:  none  Discharged Condition: Improved  Hospital Course: [name] is an 42 y.o. female who was admitted 02/04/2013 for operative treatment of Cervical spondylosis without myelopathy. Patient failed conservative treatments (please see the history and physical for the specifics) and had severe unremitting pain that affects sleep, daily activities and work/hobbies. After pre-op clearance, the patient was taken to the operating room on 02/04/2013 and underwent  Procedure(s): ANTERIOR CERVICAL DECOMPRESSION/DISCECTOMY FUSION 3 LEVELS.    Patient was given perioperative antibiotics:  Anti-infectives   Start     Dose/Rate Route Frequency Ordered Stop   02/04/13 1800  ceFAZolin (ANCEF) IVPB 1 g/50 mL premix     1 g 100 mL/hr over 30 Minutes Intravenous Every 8 hours 02/04/13 1703 02/05/13 0200   02/03/13 1420  ceFAZolin (ANCEF) IVPB 2 g/50 mL premix     2 g 100 mL/hr over 30 Minutes Intravenous 30 min pre-op 02/03/13 1420 02/04/13 1125       Patient was given sequential compression devices and early ambulation to prevent DVT.   Patient benefited  maximally from hospital stay and there were no complications. At the time of discharge, the patient was urinating/moving their bowels without difficulty, tolerating a regular diet, pain is controlled with oral pain medications and they have been cleared by PT/OT.   Recent vital signs: No data found.    Recent laboratory studies: No results found for this basename: WBC, HGB, HCT, PLT, NA, K, CL, CO2, BUN, CREATININE, GLUCOSE, PT, INR, CALCIUM, 2,  in the last 72 hours   Discharge Medications:     Medication List    STOP taking these medications       ALPRAZolam 0.5 MG tablet  Commonly known as:  XANAX     HYDROcodone-acetaminophen 10-325 MG per tablet  Commonly known as:  NORCO     ibuprofen 200 MG tablet  Commonly known as:  ADVIL,MOTRIN      TAKE these medications       diazepam 10 MG tablet  Commonly known as:  VALIUM  Take 1 tablet (10 mg total) by mouth every 8 (eight) hours as needed for anxiety (muscle spasms).     docusate sodium 100 MG capsule  Commonly known as:  COLACE  Take 1 capsule (100 mg total) by mouth 3 (three) times daily as needed for constipation.     DULoxetine 60 MG capsule  Commonly known as:  CYMBALTA  Take 60 mg by mouth at bedtime.     eszopiclone 3 MG Tabs  Generic drug:  Eszopiclone  Take 3 mg by mouth at bedtime. Take immediately before bedtime     fexofenadine-pseudoephedrine 60-120 MG per tablet  Commonly known as:  ALLEGRA-D  Take 1 tablet by mouth every morning.     omeprazole 40 MG capsule  Commonly known as:  PRILOSEC  Take 40 mg by mouth at bedtime.     ondansetron 4 MG tablet  Commonly known as:  ZOFRAN  Take 1 tablet (4 mg total) by mouth every 8 (eight) hours as needed for nausea.     oxyCODONE 15 MG immediate release tablet  Commonly known as:  ROXICODONE  Take 1 tablet (15 mg total) by mouth every 6 (six) hours as needed for pain.     polyethylene glycol powder powder  Commonly known as:  GLYCOLAX  Take 17 g by mouth  daily.     Vitamin D (Ergocalciferol) 50000 UNITS Caps  Commonly known as:  DRISDOL  Take 50,000 Units by mouth every 7 (seven) days. Takes every Monday nights.        Diagnostic Studies: Dg Cervical Spine 2-3 Views  02/04/2013  *RADIOLOGY REPORT*  Clinical Data: Cervical fusion.  CERVICAL SPINE - 2-3 VIEW  Comparison: None.  Findings: We are provided three fluoroscopic intraoperative spot views of the cervical spine.  Images demonstrate anterior plate and screws interbody spacers from C4-C7.  No acute abnormality is identified.  IMPRESSION: C4-7 ACDF.   Original Report Authenticated By: Holley Dexter, M.D.    Dg Cervical Spine 2 Or 3 Views  02/04/2013  *RADIOLOGY REPORT*  Clinical Data: 42 year old female status post ACDF.  CERVICAL SPINE - 2-3 VIEW  Comparison: 1516 hours the same day and earlier.  Findings: Portable cross-table lateral view the cervical spine. ACDF hardware in place beginning at the C4-C5 level and extending caudally.  The inferior extent of the hardware is not penetrated on the lateral view.  On the AP view the hardware extends to the C7 vertebral body. Hardware appears grossly intact.  IMPRESSION: C4-C5, C5-C6, and C6-C7 ACDF hardware placed.   Original Report Authenticated By: Erskine Speed, M.D.    Dg Cervical Spine 2 Or 3 Views  01/29/2013  *RADIOLOGY REPORT*  Clinical Data: Patient for cervical surgery.  CERVICAL SPINE - 2-3 VIEW  Comparison: None.  Findings: Vertebral body height and alignment are maintained.  Loss of disc space height and endplate spurring are seen at C5-6 and C6- 7.  Prevertebral soft tissues appear normal.  Lung apices are clear.  IMPRESSION: No acute finding.   Original Report Authenticated By: Holley Dexter, M.D.         Discharge Plan:  discharge to home  Disposition: home    Signed: Venita Lick D for Dr. Venita Lick [facility] [phone] 02/10/2013, 10:27 AM

## 2014-03-11 IMAGING — CR DG CERVICAL SPINE 2 OR 3 VIEWS
2 series · 2 of 2 positions shown · non-contrast
Comparison: 8481 hours the same day and earlier.

CLINICAL DATA: 41-year-old female status post ACDF.

CERVICAL SPINE - 2-3 VIEW

[AP (1 of 2)]
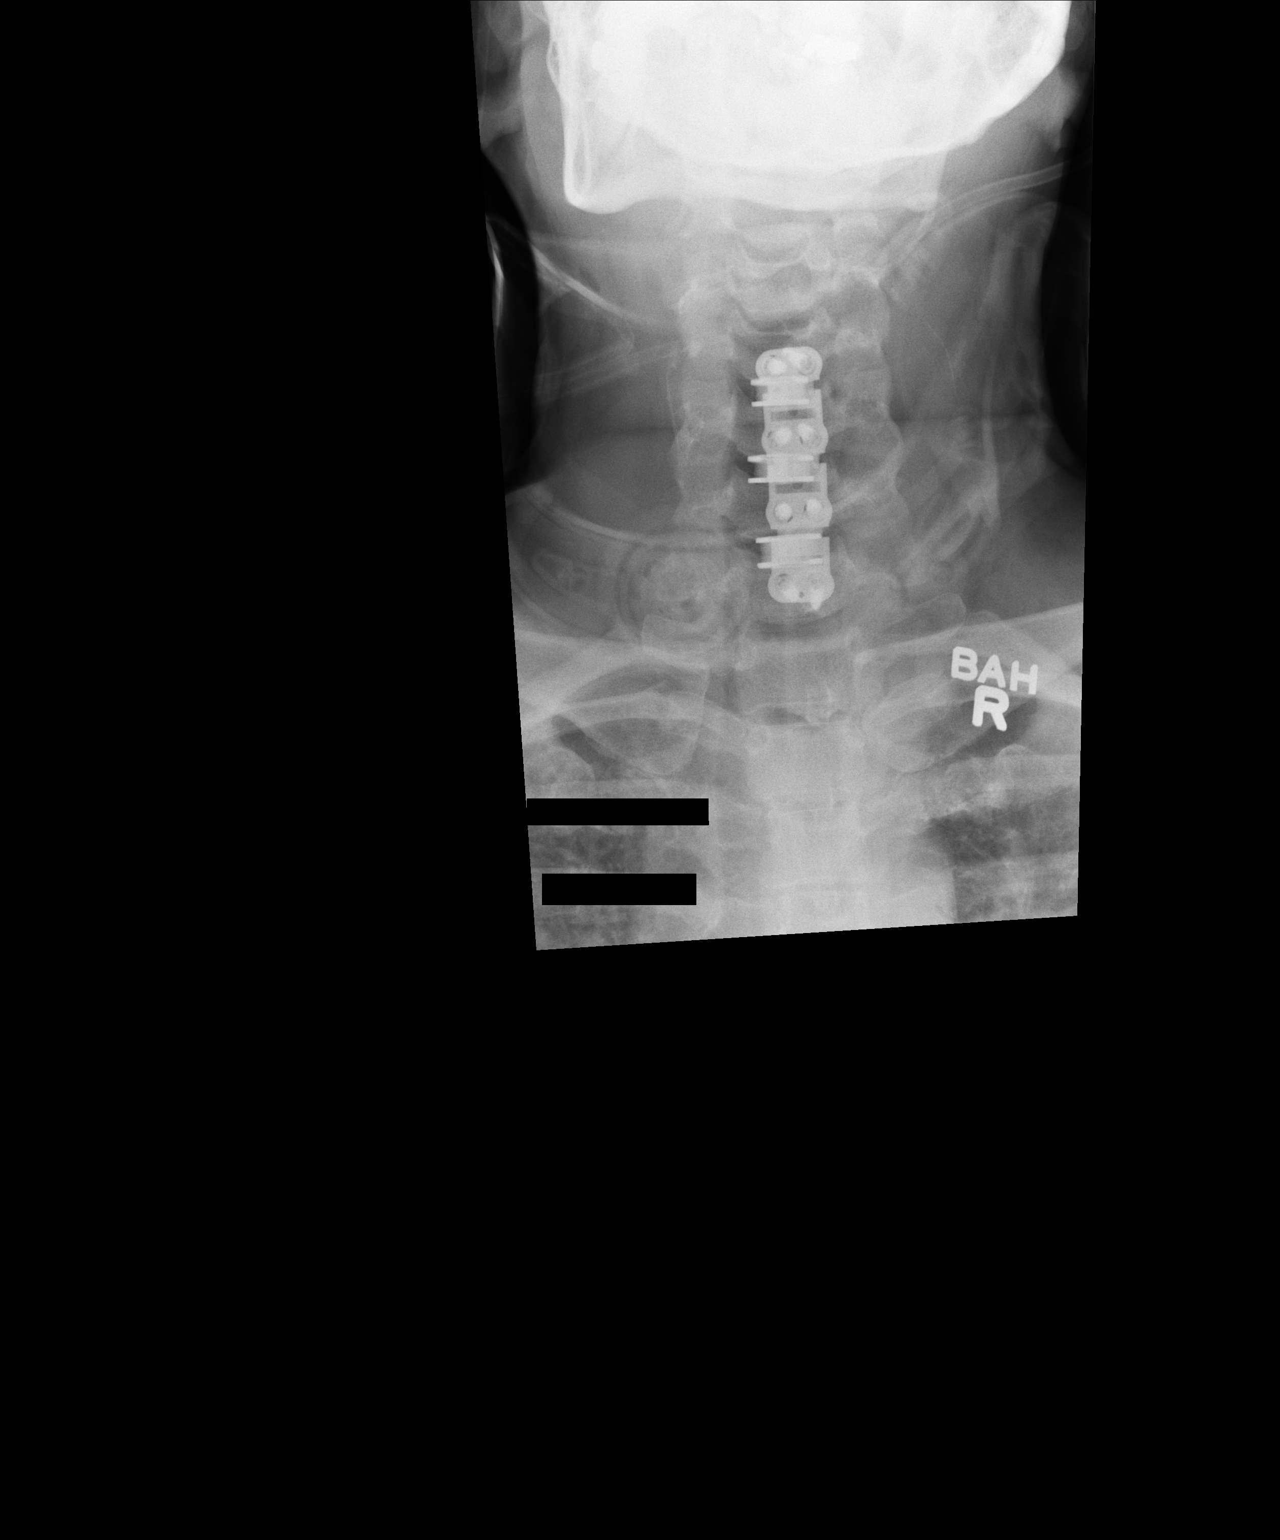

[AP (2 of 2)]
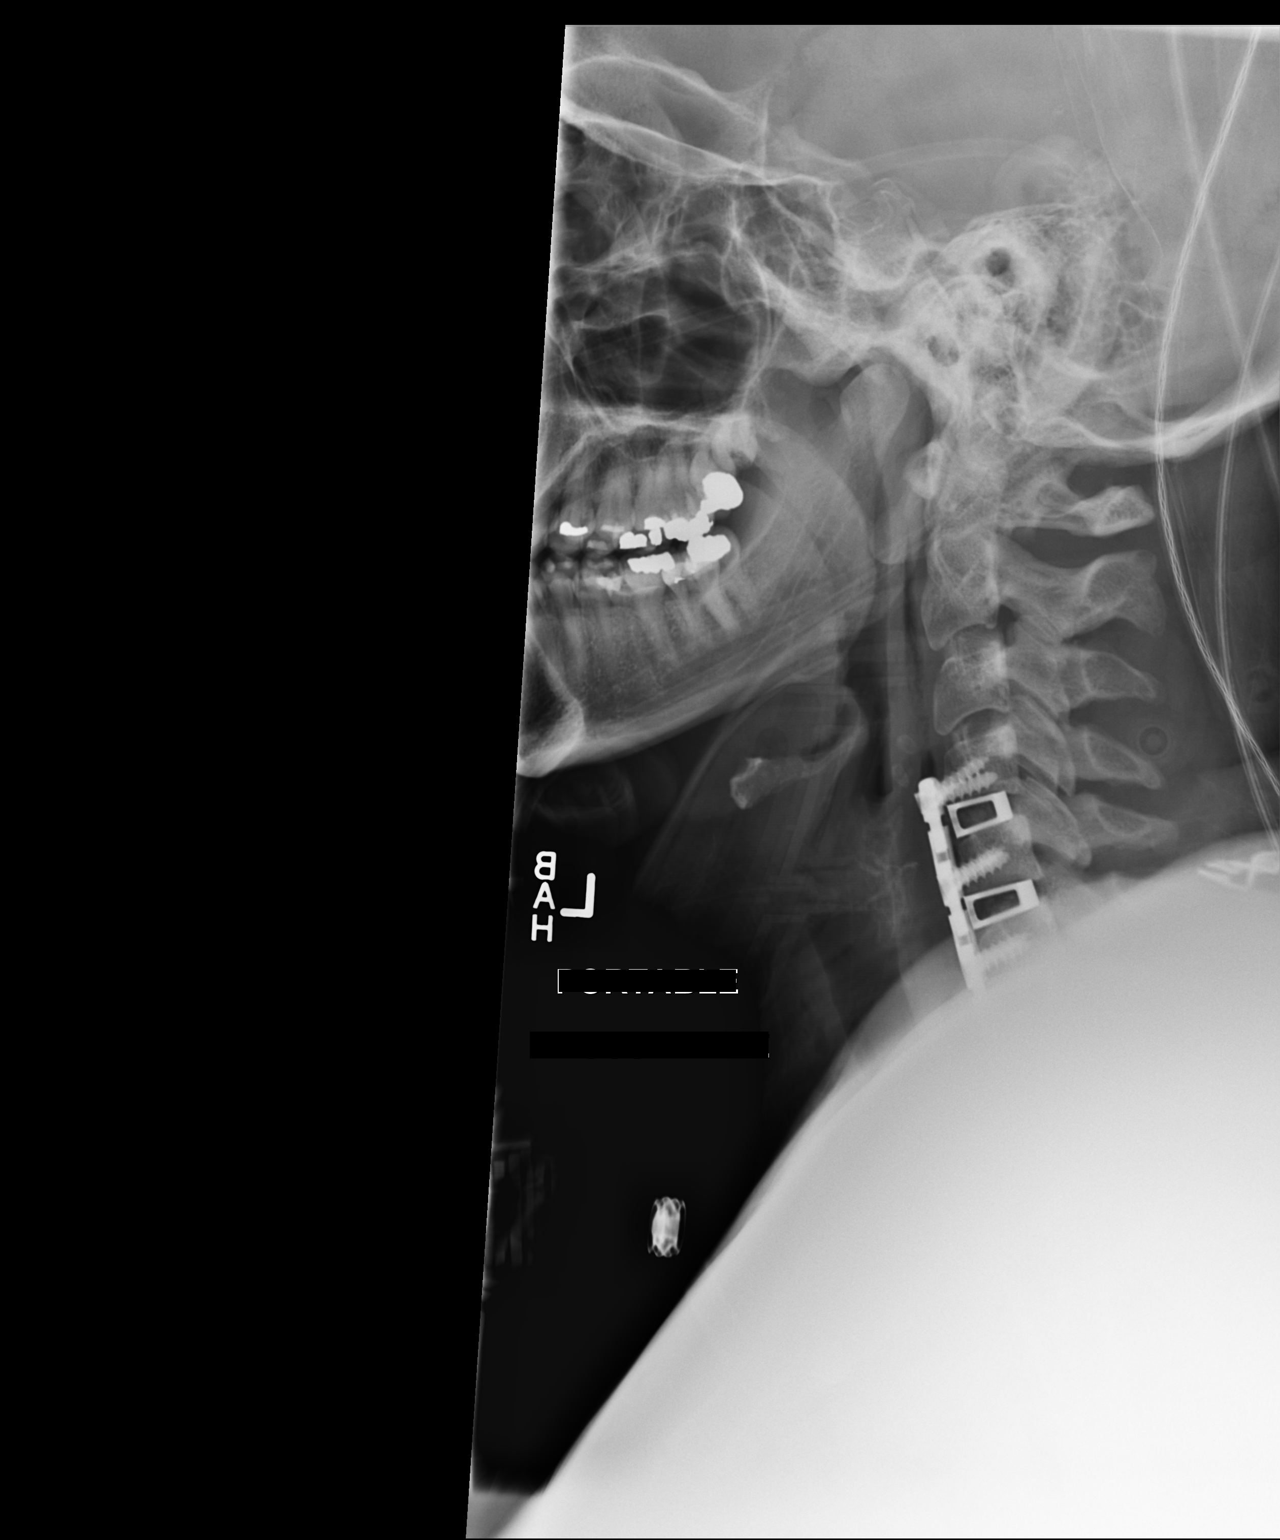

[2 of 2 positions shown; findings below may reference images not displayed]

FINDINGS: Portable cross-table lateral view the cervical spine.
ACDF hardware in place beginning at the C4-C5 level and extending
caudally.  The inferior extent of the hardware is not penetrated on
the lateral view.  On the AP view the hardware extends to the C7
vertebral body. Hardware appears grossly intact.
IMPRESSION: C4-C5, C5-C6, and C6-C7 ACDF hardware placed.

## 2015-02-21 ENCOUNTER — Encounter (HOSPITAL_BASED_OUTPATIENT_CLINIC_OR_DEPARTMENT_OTHER): Payer: Self-pay | Admitting: *Deleted

## 2015-02-22 ENCOUNTER — Encounter (HOSPITAL_BASED_OUTPATIENT_CLINIC_OR_DEPARTMENT_OTHER): Payer: Self-pay | Admitting: *Deleted

## 2015-02-22 NOTE — Progress Notes (Signed)
NPO AFTER MN.  ARRIVE AT 0600.  NEEDS ISTAT AND URINE PREG.  PRE-OP ORDERS PENDING,  CALLED OFFICE.  WILL TAKE CYMALTA AM DOS W/ SIPS OF WATER AND IF NEEDED TAKE HYDROCODONE AND VALIUM.

## 2015-02-23 NOTE — H&P (Signed)
  44 year old G 1 P 2 desires permanent sterilization and has menorrhagia.  Past Medical History  Diagnosis Date  . Complication of anesthesia   . PONV (postoperative nausea and vomiting)   . Anxiety   . GERD (gastroesophageal reflux disease)   . Seasonal allergies   . History of seizures as a child     controlled w/ rx med.  until age 44-- NO SEIZURES SINCE  . Depression   . Chronic neck pain     post surgery 2014  . Menorrhagia   . ADHD (attention deficit hyperactivity disorder)   . Vitamin D deficiency   . Wears glasses    Past Surgical History  Procedure Laterality Date  . Anterior cervical decomp/discectomy fusion N/A 02/04/2013    Procedure: ANTERIOR CERVICAL DECOMPRESSION/DISCECTOMY FUSION 3 LEVELS;  Surgeon: Melina Schools, MD;  Location: [place];  Service: Orthopedics;  Laterality: N/A;  anterior cervical discectomy and fusion cervical four-five, five-six, six-seven   . Diagnostic laparoscopy  1999 approx.    infertility   History reviewed. No pertinent family history. History   Social History  . Marital Status: Married    Spouse Name: N/A  . Number of Children: N/A  . Years of Education: N/A   Occupational History  . Not on file.   Social History Main Topics  . Smoking status: Never Smoker   . Smokeless tobacco: Never Used  . Alcohol Use: Yes     Comment: rare use of wine   . Drug Use: No  . Sexual Activity: Yes   Other Topics Concern  . Not on file   Social History Narrative   Allergies: NKDA  Ht 5' 7.5" (1.715 m)  Wt 104.327 kg (230 lb)  BMI 35.47 kg/m2  LMP 02/01/2015 No results found for this or any previous visit (from the past 24 hour(s)). General alert and oriented Lung CTAB Car RRR Abdomen is soft and non tender Pelvic is normal  IMPRESSION: Desires Permanent sterilization Menorrhagia  PLAN: [place] BTL HTA RISKS REVIEWED Consent signed

## 2015-02-24 ENCOUNTER — Ambulatory Visit (HOSPITAL_BASED_OUTPATIENT_CLINIC_OR_DEPARTMENT_OTHER)
Admission: RE | Admit: 2015-02-24 | Discharge: 2015-02-24 | Disposition: A | Discharge status: Home / Self Care | Payer: 59 | Source: Ambulatory Visit | Attending: Obstetrics and Gynecology | Admitting: Obstetrics and Gynecology

## 2015-02-24 ENCOUNTER — Ambulatory Visit (HOSPITAL_BASED_OUTPATIENT_CLINIC_OR_DEPARTMENT_OTHER): Payer: 59 | Admitting: Anesthesiology

## 2015-02-24 ENCOUNTER — Encounter (HOSPITAL_BASED_OUTPATIENT_CLINIC_OR_DEPARTMENT_OTHER): Payer: Self-pay

## 2015-02-24 ENCOUNTER — Encounter (HOSPITAL_BASED_OUTPATIENT_CLINIC_OR_DEPARTMENT_OTHER)
Admission: RE | Disposition: A | Discharge status: Home / Self Care | Payer: Self-pay | Source: Ambulatory Visit | Attending: Obstetrics and Gynecology

## 2015-02-24 DIAGNOSIS — N852 Hypertrophy of uterus: Secondary | ICD-10-CM | POA: Insufficient documentation

## 2015-02-24 DIAGNOSIS — N920 Excessive and frequent menstruation with regular cycle: Secondary | ICD-10-CM | POA: Insufficient documentation

## 2015-02-24 DIAGNOSIS — F909 Attention-deficit hyperactivity disorder, unspecified type: Secondary | ICD-10-CM | POA: Diagnosis not present

## 2015-02-24 DIAGNOSIS — Z981 Arthrodesis status: Secondary | ICD-10-CM | POA: Insufficient documentation

## 2015-02-24 DIAGNOSIS — G8929 Other chronic pain: Secondary | ICD-10-CM | POA: Diagnosis not present

## 2015-02-24 DIAGNOSIS — Z302 Encounter for sterilization: Secondary | ICD-10-CM | POA: Insufficient documentation

## 2015-02-24 DIAGNOSIS — M542 Cervicalgia: Secondary | ICD-10-CM | POA: Insufficient documentation

## 2015-02-24 DIAGNOSIS — K219 Gastro-esophageal reflux disease without esophagitis: Secondary | ICD-10-CM | POA: Insufficient documentation

## 2015-02-24 DIAGNOSIS — M1990 Unspecified osteoarthritis, unspecified site: Secondary | ICD-10-CM | POA: Insufficient documentation

## 2015-02-24 DIAGNOSIS — E559 Vitamin D deficiency, unspecified: Secondary | ICD-10-CM | POA: Insufficient documentation

## 2015-02-24 HISTORY — DX: Major depressive disorder, single episode, unspecified: F32.9

## 2015-02-24 HISTORY — DX: Vitamin D deficiency, unspecified: E55.9

## 2015-02-24 HISTORY — DX: Excessive and frequent menstruation with regular cycle: N92.0

## 2015-02-24 HISTORY — DX: Depression, unspecified: F32.A

## 2015-02-24 HISTORY — PX: DILITATION & CURRETTAGE/HYSTROSCOPY WITH HYDROTHERMAL ABLATION: SHX5570

## 2015-02-24 HISTORY — PX: LAPAROSCOPIC TUBAL LIGATION: SHX1937

## 2015-02-24 HISTORY — DX: Presence of spectacles and contact lenses: Z97.3

## 2015-02-24 HISTORY — DX: Personal history of other diseases of the nervous system and sense organs: Z86.69

## 2015-02-24 HISTORY — DX: Cervicalgia: M54.2

## 2015-02-24 HISTORY — DX: Attention-deficit hyperactivity disorder, unspecified type: F90.9

## 2015-02-24 HISTORY — DX: Other chronic pain: G89.29

## 2015-02-24 LAB — POCT I-STAT 4, (NA,K, GLUC, HGB,HCT)
GLUCOSE: 110 mg/dL — AB (ref 65–99)
HCT: 32 % — ABNORMAL LOW (ref 36.0–46.0)
Hemoglobin: 10.9 g/dL — ABNORMAL LOW (ref 12.0–15.0)
Potassium: 3.9 mmol/L (ref 3.5–5.1)
Sodium: 139 mmol/L (ref 135–145)

## 2015-02-24 SURGERY — LIGATION, FALLOPIAN TUBE, LAPAROSCOPIC
Anesthesia: General | Site: Vagina

## 2015-02-24 MED ORDER — LACTATED RINGERS IV SOLN
INTRAVENOUS | Status: DC
  Filled 2015-02-24: qty 1000

## 2015-02-24 MED ORDER — FENTANYL CITRATE (PF) 100 MCG/2ML IJ SOLN
INTRAMUSCULAR | Status: DC | PRN
  Administered 2015-02-24: 50 ug via INTRAVENOUS
  Administered 2015-02-24: 100 ug via INTRAVENOUS
  Administered 2015-02-24: 50 ug via INTRAVENOUS

## 2015-02-24 MED ORDER — METOCLOPRAMIDE HCL 5 MG/ML IJ SOLN
INTRAMUSCULAR | Status: AC
  Filled 2015-02-24: qty 2

## 2015-02-24 MED ORDER — HYDROCODONE-ACETAMINOPHEN 10-325 MG PO TABS
ORAL_TABLET | ORAL | Status: AC
  Filled 2015-02-24: qty 1

## 2015-02-24 MED ORDER — ONDANSETRON HCL 4 MG/2ML IJ SOLN
INTRAMUSCULAR | Status: DC | PRN
  Administered 2015-02-24: 4 mg via INTRAVENOUS

## 2015-02-24 MED ORDER — LIDOCAINE HCL (CARDIAC) 20 MG/ML IV SOLN
INTRAVENOUS | Status: DC | PRN
  Administered 2015-02-24: 100 mg via INTRAVENOUS

## 2015-02-24 MED ORDER — CEFAZOLIN SODIUM-DEXTROSE 2-3 GM-% IV SOLR
2.0000 g | INTRAVENOUS | Status: AC
  Administered 2015-02-24: 2 g via INTRAVENOUS
  Filled 2015-02-24: qty 50

## 2015-02-24 MED ORDER — NEOSTIGMINE METHYLSULFATE 10 MG/10ML IV SOLN
INTRAVENOUS | Status: DC | PRN
  Administered 2015-02-24: 3 mg via INTRAVENOUS

## 2015-02-24 MED ORDER — CEFAZOLIN SODIUM-DEXTROSE 2-3 GM-% IV SOLR
INTRAVENOUS | Status: AC
  Filled 2015-02-24: qty 50

## 2015-02-24 MED ORDER — LIDOCAINE HCL 1 % IJ SOLN
INTRAMUSCULAR | Status: DC | PRN
  Administered 2015-02-24: 10 mL

## 2015-02-24 MED ORDER — LACTATED RINGERS IR SOLN: Status: DC | PRN

## 2015-02-24 MED ORDER — FAMOTIDINE IN NACL 20-0.9 MG/50ML-% IV SOLN
20.0000 mg | Freq: Two times a day (BID) | INTRAVENOUS | Status: DC
  Administered 2015-02-24 (×2): 20 mg via INTRAVENOUS
  Filled 2015-02-24: qty 50

## 2015-02-24 MED ORDER — HYDROCODONE-ACETAMINOPHEN 10-325 MG PO TABS
1.0000 | ORAL_TABLET | Freq: Four times a day (QID) | ORAL | Status: DC | PRN
  Administered 2015-02-24: 1 via ORAL
  Filled 2015-02-24: qty 2

## 2015-02-24 MED ORDER — KETOROLAC TROMETHAMINE 30 MG/ML IJ SOLN
INTRAMUSCULAR | Status: DC | PRN
  Administered 2015-02-24: 30 mg via INTRAVENOUS

## 2015-02-24 MED ORDER — MEPERIDINE HCL 25 MG/ML IJ SOLN
6.2500 mg | INTRAMUSCULAR | Status: DC | PRN
  Filled 2015-02-24: qty 1

## 2015-02-24 MED ORDER — SCOPOLAMINE 1 MG/3DAYS TD PT72
MEDICATED_PATCH | TRANSDERMAL | Status: DC | PRN
  Administered 2015-02-24: 1 via TRANSDERMAL

## 2015-02-24 MED ORDER — SODIUM CHLORIDE 0.9 % IR SOLN
Status: DC | PRN
  Administered 2015-02-24: 3000 mL

## 2015-02-24 MED ORDER — SCOPOLAMINE 1 MG/3DAYS TD PT72
MEDICATED_PATCH | TRANSDERMAL | Status: AC
  Filled 2015-02-24: qty 1

## 2015-02-24 MED ORDER — PROMETHAZINE HCL 25 MG/ML IJ SOLN
6.2500 mg | INTRAMUSCULAR | Status: DC | PRN
  Filled 2015-02-24: qty 1

## 2015-02-24 MED ORDER — SUCCINYLCHOLINE CHLORIDE 20 MG/ML IJ SOLN
INTRAMUSCULAR | Status: DC | PRN
  Administered 2015-02-24: 100 mg via INTRAVENOUS

## 2015-02-24 MED ORDER — GLYCOPYRROLATE 0.2 MG/ML IJ SOLN
INTRAMUSCULAR | Status: DC | PRN
  Administered 2015-02-24: 0.4 mg via INTRAVENOUS

## 2015-02-24 MED ORDER — METOCLOPRAMIDE HCL 5 MG/ML IJ SOLN
10.0000 mg | Freq: Once | INTRAMUSCULAR | Status: AC
  Administered 2015-02-24: 10 mg via INTRAVENOUS
  Filled 2015-02-24: qty 2

## 2015-02-24 MED ORDER — FENTANYL CITRATE (PF) 100 MCG/2ML IJ SOLN
INTRAMUSCULAR | Status: AC
Start: 2015-02-24 — End: 2015-02-24
  Filled 2015-02-24: qty 4

## 2015-02-24 MED ORDER — MIDAZOLAM HCL 2 MG/2ML IJ SOLN
INTRAMUSCULAR | Status: AC
  Filled 2015-02-24: qty 2

## 2015-02-24 MED ORDER — LACTATED RINGERS IV SOLN
INTRAVENOUS | Status: DC
  Administered 2015-02-24 (×2): via INTRAVENOUS
  Filled 2015-02-24: qty 1000

## 2015-02-24 MED ORDER — DEXAMETHASONE SODIUM PHOSPHATE 4 MG/ML IJ SOLN
INTRAMUSCULAR | Status: DC | PRN
  Administered 2015-02-24: 10 mg via INTRAVENOUS

## 2015-02-24 MED ORDER — ROCURONIUM BROMIDE 100 MG/10ML IV SOLN
INTRAVENOUS | Status: DC | PRN
  Administered 2015-02-24: 40 mg via INTRAVENOUS

## 2015-02-24 MED ORDER — PROPOFOL 10 MG/ML IV BOLUS
INTRAVENOUS | Status: DC | PRN
  Administered 2015-02-24: 200 mg via INTRAVENOUS

## 2015-02-24 MED ORDER — FENTANYL CITRATE (PF) 100 MCG/2ML IJ SOLN
25.0000 ug | INTRAMUSCULAR | Status: DC | PRN
  Filled 2015-02-24: qty 1

## 2015-02-24 MED ORDER — BUPIVACAINE HCL (PF) 0.25 % IJ SOLN
INTRAMUSCULAR | Status: DC | PRN
  Administered 2015-02-24: 5 mL

## 2015-02-24 MED ORDER — MIDAZOLAM HCL 5 MG/5ML IJ SOLN
INTRAMUSCULAR | Status: DC | PRN
  Administered 2015-02-24: 2 mg via INTRAVENOUS

## 2015-02-24 SURGICAL SUPPLY — 60 items
APPLICATOR COTTON TIP 6IN STRL (MISCELLANEOUS) ×4 IMPLANT
BAG SPEC RTRVL LRG 6X4 10 (ENDOMECHANICALS)
BAG URINE DRAINAGE (UROLOGICAL SUPPLIES) IMPLANT
BANDAGE ADHESIVE 1X3 (GAUZE/BANDAGES/DRESSINGS) IMPLANT
BLADE CLIPPER SURG (BLADE) ×4 IMPLANT
BLADE SURG 11 STRL SS (BLADE) ×4 IMPLANT
CANISTER SUCTION 2500CC (MISCELLANEOUS) IMPLANT
CATH FOLEY 2WAY SLVR  5CC 16FR (CATHETERS)
CATH FOLEY 2WAY SLVR 5CC 16FR (CATHETERS) IMPLANT
CATH ROBINSON RED A/P 16FR (CATHETERS) ×4 IMPLANT
CLOSURE WOUND 1/4X4 (GAUZE/BANDAGES/DRESSINGS)
CLOTH BEACON ORANGE TIMEOUT ST (SAFETY) ×4 IMPLANT
COVER MAYO STAND STRL (DRAPES) ×2 IMPLANT
DRAPE UNDERBUTTOCKS STRL (DRAPE) ×4 IMPLANT
DRSG TELFA 3X8 NADH (GAUZE/BANDAGES/DRESSINGS) IMPLANT
ELECT REM PT RETURN 9FT ADLT (ELECTROSURGICAL) ×4
ELECTRODE REM PT RTRN 9FT ADLT (ELECTROSURGICAL) ×2 IMPLANT
GLOVE BIO SURGEON STRL SZ 6.5 (GLOVE) ×3 IMPLANT
GLOVE BIO SURGEONS STRL SZ 6.5 (GLOVE) ×1
GLOVE BIOGEL PI IND STRL 7.5 (GLOVE) IMPLANT
GLOVE BIOGEL PI INDICATOR 7.5 (GLOVE) ×4
GLOVE SURG SS PI 7.5 STRL IVOR (GLOVE) ×2 IMPLANT
GOWN STRL REUS W/ TWL LRG LVL3 (GOWN DISPOSABLE) ×4 IMPLANT
GOWN STRL REUS W/TWL LRG LVL3 (GOWN DISPOSABLE) ×12 IMPLANT
NDL HYPO 25X1 1.5 SAFETY (NEEDLE) IMPLANT
NDL INSUFFLATION 14GA 120MM (NEEDLE) ×2 IMPLANT
NDL INSUFFLATION 14GA 150MM (NEEDLE) IMPLANT
NDL SPNL 22GX3.5 QUINCKE BK (NEEDLE) IMPLANT
NEEDLE HYPO 25X1 1.5 SAFETY (NEEDLE) IMPLANT
NEEDLE INSUFFLATION 14GA 120MM (NEEDLE) ×4 IMPLANT
NEEDLE INSUFFLATION 14GA 150MM (NEEDLE) IMPLANT
NEEDLE SPNL 22GX3.5 QUINCKE BK (NEEDLE) ×4 IMPLANT
NS IRRIG 500ML POUR BTL (IV SOLUTION) ×4 IMPLANT
PACK BASIN DAY SURGERY FS (CUSTOM PROCEDURE TRAY) ×4 IMPLANT
PACK LAPAROSCOPY II (CUSTOM PROCEDURE TRAY) ×4 IMPLANT
PAD DRESSING TELFA 3X8 NADH (GAUZE/BANDAGES/DRESSINGS) IMPLANT
PAD OB MATERNITY 4.3X12.25 (PERSONAL CARE ITEMS) ×4 IMPLANT
PAD PREP 24X48 CUFFED NSTRL (MISCELLANEOUS) ×4 IMPLANT
PADDING ION DISPOSABLE (MISCELLANEOUS) ×2 IMPLANT
POUCH SPECIMEN RETRIEVAL 10MM (ENDOMECHANICALS) IMPLANT
SCISSORS LAP 5X35 DISP (ENDOMECHANICALS) IMPLANT
SET GENESYS HTA PROCERVA (MISCELLANEOUS) ×2 IMPLANT
SET IRRIG TUBING LAPAROSCOPIC (IRRIGATION / IRRIGATOR) IMPLANT
SOLUTION ANTI FOG 6CC (MISCELLANEOUS) ×4 IMPLANT
STRIP CLOSURE SKIN 1/4X4 (GAUZE/BANDAGES/DRESSINGS) IMPLANT
SUT VIC AB 3-0 PS2 18 (SUTURE) ×4
SUT VIC AB 3-0 PS2 18XBRD (SUTURE) ×2 IMPLANT
SUT VICRYL 0 UR6 27IN ABS (SUTURE) ×4 IMPLANT
SYR CONTROL 10ML LL (SYRINGE) ×2 IMPLANT
SYRINGE 10CC LL (SYRINGE) ×14 IMPLANT
TOWEL OR 17X24 6PK STRL BLUE (TOWEL DISPOSABLE) ×8 IMPLANT
TRAY DSU PREP LF (CUSTOM PROCEDURE TRAY) ×4 IMPLANT
TROCAR 12M 150ML BLUNT (TROCAR) IMPLANT
TROCAR OPTI TIP 5M 100M (ENDOMECHANICALS) IMPLANT
TROCAR XCEL BLUNT TIP 100MML (ENDOMECHANICALS) IMPLANT
TROCAR XCEL DIL TIP R 11M (ENDOMECHANICALS) ×2 IMPLANT
TROCAR XCEL NON-BLD 11X100MML (ENDOMECHANICALS) ×2 IMPLANT
TUBING INSUFFLATION 10FT LAP (TUBING) ×4 IMPLANT
VACUUM HOSE/TUBING 7/8INX6FT (MISCELLANEOUS) IMPLANT
WATER STERILE IRR 500ML POUR (IV SOLUTION) ×4 IMPLANT

## 2015-02-24 NOTE — Transfer of Care (Signed)
Immediate Anesthesia Transfer of Care Note  Patient: [name]  Procedure(s) Performed: Procedure(s): LAPAROSCOPIC TUBAL LIGATION (Bilateral) DILATATION & CURETTAGE/HYSTEROSCOPY WITH HYDROTHERMAL ABLATION (N/A)  Patient Location: PACU  Anesthesia Type:General  Level of Consciousness: awake, alert , oriented and patient cooperative  Airway & Oxygen Therapy: Patient Spontanous Breathing and Patient connected to nasal cannula oxygen  Post-op Assessment: Report given to RN and Post -op Vital signs reviewed and stable  Post vital signs: Reviewed and stable  Last Vitals:  Filed Vitals:   02/24/15 0628  BP: 134/73  Pulse: 90  Temp: 37 C  Resp: 18    Complications: No apparent anesthesia complications

## 2015-02-24 NOTE — Op Note (Signed)
[name], [name]              ACCOUNT NO.:  [id number]  MEDICAL RECORD NO.:  [id number]  LOCATION:                               FACILITY:  [facility]  PHYSICIAN:  Lenda Baratta L. Maureen Delatte, M.D.DATE OF BIRTH:  16-Dec-1970  DATE OF PROCEDURE:  02/24/2015 DATE OF DISCHARGE:  02/24/2015                              OPERATIVE REPORT   PREOPERATIVE DIAGNOSES:  Desires permanent sterilization and menorrhagia.  POSTOPERATIVE DIAGNOSES:  Desires permanent sterilization and menorrhagia.  PROCEDURES:  Laparoscopic bilateral tubal ligation and diagnostic hysteroscopy with HTA.  SURGEON:  Lavonya Hoerner L. Helane Rima, M.D.  ANESTHESIA:  General with paracervical block.  EBL:  Minimal.  COMPLICATIONS:  None.  PATHOLOGY:  None.  DESCRIPTION OF PROCEDURE:  The patient was taken to the operating room. She had been consented.  She was intubated.  She was prepped and draped. Time-out was performed.  The uterine manipulator was inserted and the bladder was emptied with an in-and-out catheter.  Attention was turned toward the abdomen.  A small infraumbilical incision was made.  The Veress needle was inserted and pneumoperitoneum was performed.  The Veress needle was removed and an 11 mm trocar was inserted.  The patient was then placed in Trendelenburg position.  The uterus was enlarged, appeared to be myomatous.  No endometriosis or adhesions were noted.  We took the long Kleppinger instrument and grasped with the midportion of each fallopian tube and cauterized the midportion x3 on both sides.  The trocar was removed.  The pneumoperitoneum was released and the incision was closed with an 0 Vicryl and then a 3-0 Vicryl interrupted.  A Band- Aid was applied.  Attention was turned to the vagina where the speculum was inserted.  A tenaculum was placed on the cervix.  The hysteroscope with HTA was inserted easily into the uterine cavity.  All fluid sac revealed that there was no fluid leakage.  The tenaculum  stabilizer had been applied.  An HTA was performed for the 10-minute ablation with direct visualization.  Of note, the cavity appeared to be a large, but was normal in appearance.  The ablation went fine and the hysteroscope was removed and no bleeding was noted.  All instruments were removed from the vagina.  All sponge, lap, and instrument counts were correct x2.  The patient went to recovery room in stable condition.     [name] L. Helane Rima, M.D.     Nevin Bloodgood  D:  02/24/2015  T:  02/24/2015  Job:  [id number]

## 2015-02-24 NOTE — Anesthesia Preprocedure Evaluation (Addendum)
Anesthesia Evaluation  Patient identified by MRN, date of birth, ID band Patient awake    Reviewed: Allergy & Precautions, NPO status , Patient's Chart, lab work & pertinent test results  History of Anesthesia Complications (+) PONV  Airway Mallampati: III  TM Distance: >3 FB Neck ROM: Limited    Dental no notable dental hx. (+) Teeth Intact, Dental Advisory Given   Pulmonary neg pulmonary ROS,  breath sounds clear to auscultation  Pulmonary exam normal       Cardiovascular Exercise Tolerance: Good negative cardio ROS Normal cardiovascular examRhythm:Regular Rate:Normal     Neuro/Psych Anxiety Depression S/P ACDF C4-7 negative neurological ROS     GI/Hepatic negative GI ROS, Neg liver ROS, GERD-  Medicated and Controlled,Drank 8 oz. Water this am @ 6   Endo/Other  negative endocrine ROS  Renal/GU negative Renal ROS  negative genitourinary   Musculoskeletal negative musculoskeletal ROS (+) Arthritis -, Osteoarthritis,    Abdominal   Peds negative pediatric ROS (+)  Hematology negative hematology ROS (+)   Anesthesia Other Findings S/P ACDF C4-7 limited neck mobility  Reproductive/Obstetrics negative OB ROS                           Anesthesia Physical Anesthesia Plan  ASA: II  Anesthesia Plan: General   Post-op Pain Management:    Induction: Intravenous, Rapid sequence and Cricoid pressure planned  Airway Management Planned: Oral ETT  Additional Equipment:   Intra-op Plan:   Post-operative Plan: Extubation in OR  Informed Consent: I have reviewed the patients History and Physical, chart, labs and discussed the procedure including the risks, benefits and alternatives for the proposed anesthesia with the patient or authorized representative who has indicated his/her understanding and acceptance.   Dental advisory given  Plan Discussed with: CRNA  Anesthesia Plan Comments:  (Drinking water at [place] upon arrival. Will delay until 8am and RSI. Pepcid and reglan IV.)        Anesthesia Quick Evaluation

## 2015-02-24 NOTE — Brief Op Note (Signed)
02/24/2015  9:17 AM  PATIENT:  [name]  44 y.o. female  PRE-OPERATIVE DIAGNOSIS:  menorrhagia, desires sterility  POST-OPERATIVE DIAGNOSIS:  menorrhagia, desires sterility  PROCEDURE:  Procedure(s): LAPAROSCOPIC TUBAL LIGATION (Bilateral) DILATATION & CURETTAGE/HYSTEROSCOPY WITH HYDROTHERMAL ABLATION (N/A)  SURGEON:  Surgeon(s) and Role:    * Dian Queen, MD - Primary  PHYSICIAN ASSISTANT:   ASSISTANTS: none   ANESTHESIA:   general and paracervical block  EBL:  Total I/O In: -  Out: 150 [Urine:150]  BLOOD ADMINISTERED:none  DRAINS: none   LOCAL MEDICATIONS USED:  LIDOCAINE   SPECIMEN:  No Specimen  DISPOSITION OF SPECIMEN:  N/A  COUNTS:  YES  TOURNIQUET:  * No tourniquets in log *  DICTATION: .Other Dictation: Dictation Number [id number]  PLAN OF CARE: Discharge to home after PACU  PATIENT DISPOSITION:  PACU - hemodynamically stable.   Delay start of Pharmacological VTE agent (>24hrs) due to surgical blood loss or risk of bleeding: not applicable

## 2015-02-24 NOTE — Anesthesia Procedure Notes (Signed)
Procedure Name: Intubation Date/Time: 02/24/2015 8:12 AM Performed by: Wanita Chamberlain Pre-anesthesia Checklist: Patient identified, Timeout performed, Emergency Drugs available, Suction available and Patient being monitored Patient Re-evaluated:Patient Re-evaluated prior to inductionOxygen Delivery Method: Circle system utilized Preoxygenation: Pre-oxygenation with 100% oxygen Intubation Type: IV induction, Cricoid Pressure applied and Rapid sequence Laryngoscope Size: Glidescope and 3 Grade View: Grade II Tube type: Oral Tube size: 7.0 mm Number of attempts: 1 Airway Equipment and Method: Rigid stylet,  Bite block and Video-laryngoscopy Placement Confirmation: ETT inserted through vocal cords under direct vision,  positive ETCO2 and breath sounds checked- equal and bilateral Secured at: 21 cm Tube secured with: Tape Dental Injury: Teeth and Oropharynx as per pre-operative assessment  Difficulty Due To: Difficult Airway- due to reduced neck mobility and Difficult Airway- due to large tongue Comments: S/P ACDF C4-7

## 2015-02-24 NOTE — Discharge Instructions (Signed)

## 2015-02-24 NOTE — Progress Notes (Signed)
H and P on the chart No significant changes Will proceed with laparoscopic BTL, HTA Consent signed

## 2015-02-24 NOTE — Anesthesia Postprocedure Evaluation (Signed)
  Anesthesia Post-op Note  Patient: [name]  Procedure(s) Performed: Procedure(s) (LRB): LAPAROSCOPIC TUBAL LIGATION (Bilateral) DILATATION & CURETTAGE/HYSTEROSCOPY WITH HYDROTHERMAL ABLATION (N/A)  Patient Location: PACU  Anesthesia Type: General  Level of Consciousness: awake and alert   Airway and Oxygen Therapy: Patient Spontanous Breathing  Post-op Pain: mild  Post-op Assessment: Post-op Vital signs reviewed, Patient's Cardiovascular Status Stable, Respiratory Function Stable, Patent Airway and No signs of Nausea or vomiting  Last Vitals:  Filed Vitals:   02/24/15 1030  BP: 129/71  Pulse: 88  Temp:   Resp: 17    Post-op Vital Signs: stable   Complications: No apparent anesthesia complications

## 2015-02-25 ENCOUNTER — Encounter (HOSPITAL_BASED_OUTPATIENT_CLINIC_OR_DEPARTMENT_OTHER): Payer: Self-pay | Admitting: Obstetrics and Gynecology

## 2015-02-25 LAB — POCT PREGNANCY, URINE: PREG TEST UR: NEGATIVE

## 2015-08-31 NOTE — H&P (Signed)
44 year old G 1 P 2 with history of BTL and endometrial ablation presents for treatment of menorrhagia.  She has a large submucosal fibroid that is too large to resect with the hysteroscope because it is partially intramural.  Her cycles are lasting up to 3 weeks and heavy with clotting.  Past Medical History  Diagnosis Date  . Complication of anesthesia   . PONV (postoperative nausea and vomiting)   . Anxiety   . GERD (gastroesophageal reflux disease)   . Seasonal allergies   . History of seizures as a child     controlled w/ rx med.  until age 50-- NO SEIZURES SINCE  . Depression   . Chronic neck pain     post surgery 2014  . Menorrhagia   . ADHD (attention deficit hyperactivity disorder)   . Vitamin D deficiency   . Wears glasses    Past Surgical History  Procedure Laterality Date  . Anterior cervical decomp/discectomy fusion N/A 02/04/2013    Procedure: ANTERIOR CERVICAL DECOMPRESSION/DISCECTOMY FUSION 3 LEVELS;  Surgeon: Melina Schools, MD;  Location: [place];  Service: Orthopedics;  Laterality: N/A;  anterior cervical discectomy and fusion cervical four-five, five-six, six-seven   . Diagnostic laparoscopy  1999 approx.    infertility  . Laparoscopic tubal ligation Bilateral 02/24/2015    Procedure: LAPAROSCOPIC TUBAL LIGATION;  Surgeon: Dian Queen, MD;  Location: [facility];  Service: Gynecology;  Laterality: Bilateral;  . Dilitation & currettage/hystroscopy with hydrothermal ablation N/A 02/24/2015    Procedure: DILATATION & CURETTAGE/HYSTEROSCOPY WITH HYDROTHERMAL ABLATION;  Surgeon: Dian Queen, MD;  Location: [place];  Service: Gynecology;  Laterality: N/A;   Prior to Admission medications   Medication Sig Start Date End Date Taking? Authorizing Provider  ALPRAZolam Duanne Moron) 0.5 MG tablet Take 0.5-1 mg by mouth 2 (two) times daily as needed for anxiety.    Historical Provider, MD  diazepam (VALIUM) 5 MG tablet Take 5 mg by mouth 3  (three) times daily as needed for anxiety.    Historical Provider, MD  DULoxetine (CYMBALTA) 60 MG capsule Take 60 mg by mouth 2 (two) times daily.     Historical Provider, MD  Eszopiclone (ESZOPICLONE) 3 MG TABS Take 3 mg by mouth at bedtime. Take immediately before bedtime    Historical Provider, MD  hydrochlorothiazide (HYDRODIURIL) 25 MG tablet Take 25 mg by mouth every morning.    Historical Provider, MD  HYDROcodone-acetaminophen (NORCO) 10-325 MG per tablet Take 1-2 tablets by mouth every 6 (six) hours as needed.    Historical Provider, MD  lisdexamfetamine (VYVANSE) 70 MG capsule Take 70 mg by mouth every morning.    Historical Provider, MD  omeprazole (PRILOSEC) 40 MG capsule Take 40 mg by mouth at bedtime.    Historical Provider, MD  Vitamin D, Ergocalciferol, (DRISDOL) 50000 UNITS CAPS Take 50,000 Units by mouth every 7 (seven) days. Takes every Monday nights.    Historical Provider, MD   Allergies : None Social History   Social History  . Marital Status: Married    Spouse Name: N/A  . Number of Children: N/A  . Years of Education: N/A   Occupational History  . Not on file.   Social History Main Topics  . Smoking status: Never Smoker   . Smokeless tobacco: Never Used  . Alcohol Use: Yes     Comment: rare use of wine   . Drug Use: No  . Sexual Activity: Yes   Other Topics Concern  . Not  on file   Social History Narrative   Family history  Heart disease, hypertension, arthritis, diabetes  Vital signs stable General alert and oriented Lung CTAB Car RRR Abdomen is soft and non tender Pelvic  Cervix is deep in the vaginal and uterus is not enlarged She was fair descensus  IMPRESSION: Fibroid Menorrhagia  PLAN: LAVH, Bilateral salpingectomy Risks reviewed Consent signed

## 2015-09-16 ENCOUNTER — Encounter (HOSPITAL_BASED_OUTPATIENT_CLINIC_OR_DEPARTMENT_OTHER): Payer: Self-pay | Admitting: *Deleted

## 2015-09-16 NOTE — Progress Notes (Signed)
NPO AFTER MN.  ARRIVE AT 0600.  GETTING LAB WORK DONE TUES. 09-20-2015. (CBC, BMET, URINE PREG. )  WILL TAKE LYRICA AND CYMBALTA AM DOS W/ SIPS OF WATER.

## 2015-09-20 DIAGNOSIS — D649 Anemia, unspecified: Secondary | ICD-10-CM | POA: Diagnosis not present

## 2015-09-20 DIAGNOSIS — Z79891 Long term (current) use of opiate analgesic: Secondary | ICD-10-CM | POA: Diagnosis not present

## 2015-09-20 DIAGNOSIS — F329 Major depressive disorder, single episode, unspecified: Secondary | ICD-10-CM | POA: Diagnosis not present

## 2015-09-20 DIAGNOSIS — K219 Gastro-esophageal reflux disease without esophagitis: Secondary | ICD-10-CM | POA: Diagnosis not present

## 2015-09-20 DIAGNOSIS — N920 Excessive and frequent menstruation with regular cycle: Secondary | ICD-10-CM | POA: Diagnosis present

## 2015-09-20 DIAGNOSIS — D259 Leiomyoma of uterus, unspecified: Secondary | ICD-10-CM | POA: Diagnosis not present

## 2015-09-20 DIAGNOSIS — Z79899 Other long term (current) drug therapy: Secondary | ICD-10-CM | POA: Diagnosis not present

## 2015-09-20 DIAGNOSIS — F419 Anxiety disorder, unspecified: Secondary | ICD-10-CM | POA: Diagnosis not present

## 2015-09-20 DIAGNOSIS — M1990 Unspecified osteoarthritis, unspecified site: Secondary | ICD-10-CM | POA: Diagnosis not present

## 2015-09-20 LAB — CBC
HCT: 27.6 % — ABNORMAL LOW (ref 36.0–46.0)
HEMOGLOBIN: 8.1 g/dL — AB (ref 12.0–15.0)
MCH: 21.5 pg — AB (ref 26.0–34.0)
MCHC: 29.3 g/dL — AB (ref 30.0–36.0)
MCV: 73.2 fL — AB (ref 78.0–100.0)
Platelets: 544 10*3/uL — ABNORMAL HIGH (ref 150–400)
RBC: 3.77 MIL/uL — AB (ref 3.87–5.11)
RDW: 16.4 % — ABNORMAL HIGH (ref 11.5–15.5)
WBC: 8 10*3/uL (ref 4.0–10.5)

## 2015-09-20 LAB — BASIC METABOLIC PANEL
Anion gap: 11 (ref 5–15)
BUN: 14 mg/dL (ref 6–20)
CO2: 26 mmol/L (ref 22–32)
CREATININE: 0.85 mg/dL (ref 0.44–1.00)
Calcium: 9.3 mg/dL (ref 8.9–10.3)
Chloride: 103 mmol/L (ref 101–111)
GFR calc Af Amer: >60 mL/min (ref >60)
GFR calc non Af Amer: >60 mL/min (ref >60)
Glucose, Bld: 106 mg/dL — ABNORMAL HIGH (ref 65–99)
Potassium: 3.2 mmol/L — ABNORMAL LOW (ref 3.5–5.1)
SODIUM: 140 mmol/L (ref 135–145)

## 2015-09-22 ENCOUNTER — Encounter (HOSPITAL_COMMUNITY)
Admission: RE | Disposition: A | Discharge status: Home / Self Care | Payer: Self-pay | Source: Ambulatory Visit | Attending: Obstetrics and Gynecology

## 2015-09-22 ENCOUNTER — Encounter (HOSPITAL_BASED_OUTPATIENT_CLINIC_OR_DEPARTMENT_OTHER): Payer: Self-pay

## 2015-09-22 ENCOUNTER — Ambulatory Visit (HOSPITAL_BASED_OUTPATIENT_CLINIC_OR_DEPARTMENT_OTHER)
Admission: RE | Admit: 2015-09-22 | Discharge: 2015-09-23 | Disposition: A | Discharge status: Home / Self Care | Payer: Commercial Managed Care - HMO | Source: Ambulatory Visit | Attending: Obstetrics and Gynecology | Admitting: Obstetrics and Gynecology

## 2015-09-22 ENCOUNTER — Ambulatory Visit (HOSPITAL_BASED_OUTPATIENT_CLINIC_OR_DEPARTMENT_OTHER): Payer: Commercial Managed Care - HMO | Admitting: Anesthesiology

## 2015-09-22 DIAGNOSIS — N920 Excessive and frequent menstruation with regular cycle: Secondary | ICD-10-CM | POA: Insufficient documentation

## 2015-09-22 DIAGNOSIS — F419 Anxiety disorder, unspecified: Secondary | ICD-10-CM | POA: Insufficient documentation

## 2015-09-22 DIAGNOSIS — Z79891 Long term (current) use of opiate analgesic: Secondary | ICD-10-CM | POA: Insufficient documentation

## 2015-09-22 DIAGNOSIS — D219 Benign neoplasm of connective and other soft tissue, unspecified: Secondary | ICD-10-CM | POA: Diagnosis present

## 2015-09-22 DIAGNOSIS — D649 Anemia, unspecified: Secondary | ICD-10-CM | POA: Insufficient documentation

## 2015-09-22 DIAGNOSIS — M1990 Unspecified osteoarthritis, unspecified site: Secondary | ICD-10-CM | POA: Insufficient documentation

## 2015-09-22 DIAGNOSIS — D259 Leiomyoma of uterus, unspecified: Secondary | ICD-10-CM | POA: Diagnosis not present

## 2015-09-22 DIAGNOSIS — K219 Gastro-esophageal reflux disease without esophagitis: Secondary | ICD-10-CM | POA: Insufficient documentation

## 2015-09-22 DIAGNOSIS — F329 Major depressive disorder, single episode, unspecified: Secondary | ICD-10-CM | POA: Insufficient documentation

## 2015-09-22 DIAGNOSIS — Z79899 Other long term (current) drug therapy: Secondary | ICD-10-CM | POA: Insufficient documentation

## 2015-09-22 HISTORY — DX: Edema, unspecified: R60.9

## 2015-09-22 HISTORY — PX: LAPAROSCOPIC VAGINAL HYSTERECTOMY WITH SALPINGECTOMY: SHX6680

## 2015-09-22 LAB — CBC
HEMATOCRIT: 24.3 % — AB (ref 36.0–46.0)
HEMOGLOBIN: 7.1 g/dL — AB (ref 12.0–15.0)
MCH: 21.6 pg — AB (ref 26.0–34.0)
MCHC: 29.2 g/dL — AB (ref 30.0–36.0)
MCV: 74.1 fL — ABNORMAL LOW (ref 78.0–100.0)
Platelets: 399 10*3/uL (ref 150–400)
RBC: 3.28 MIL/uL — ABNORMAL LOW (ref 3.87–5.11)
RDW: 16.5 % — AB (ref 11.5–15.5)
WBC: 11 10*3/uL — ABNORMAL HIGH (ref 4.0–10.5)

## 2015-09-22 LAB — POCT PREGNANCY, URINE: Preg Test, Ur: NEGATIVE

## 2015-09-22 LAB — ABO/RH: ABO/RH(D): A POS

## 2015-09-22 LAB — PREPARE RBC (CROSSMATCH)

## 2015-09-22 SURGERY — HYSTERECTOMY, VAGINAL, LAPAROSCOPY-ASSISTED, WITH SALPINGECTOMY
Anesthesia: General | Laterality: Bilateral

## 2015-09-22 MED ORDER — DIPHENHYDRAMINE HCL 50 MG/ML IJ SOLN
25.0000 mg | Freq: Once | INTRAMUSCULAR | Status: AC
  Administered 2015-09-22: 25 mg via INTRAVENOUS
  Filled 2015-09-22: qty 0.5

## 2015-09-22 MED ORDER — HYDROMORPHONE HCL 1 MG/ML IJ SOLN
INTRAMUSCULAR | Status: DC | PRN
  Administered 2015-09-22 (×3): 1 mg via INTRAVENOUS

## 2015-09-22 MED ORDER — FENTANYL CITRATE (PF) 100 MCG/2ML IJ SOLN
INTRAMUSCULAR | Status: AC
  Filled 2015-09-22: qty 2

## 2015-09-22 MED ORDER — CEFAZOLIN SODIUM-DEXTROSE 2-3 GM-% IV SOLR
2.0000 g | INTRAVENOUS | Status: AC
  Administered 2015-09-22: 2 g via INTRAVENOUS
  Filled 2015-09-22: qty 50

## 2015-09-22 MED ORDER — DIPHENHYDRAMINE HCL 12.5 MG/5ML PO ELIX
12.5000 mg | ORAL_SOLUTION | Freq: Four times a day (QID) | ORAL | Status: DC | PRN
  Filled 2015-09-22: qty 5

## 2015-09-22 MED ORDER — SODIUM CHLORIDE 0.9 % IV SOLN
Freq: Once | INTRAVENOUS | Status: AC
  Administered 2015-09-23: 11:00:00 via INTRAVENOUS
  Filled 2015-09-22: qty 1000

## 2015-09-22 MED ORDER — FENTANYL CITRATE (PF) 100 MCG/2ML IJ SOLN
25.0000 ug | INTRAMUSCULAR | Status: DC | PRN
  Administered 2015-09-22 (×4): 25 ug via INTRAVENOUS
  Filled 2015-09-22: qty 1

## 2015-09-22 MED ORDER — ALBUMIN HUMAN 5 % IV SOLN
INTRAVENOUS | Status: AC
  Filled 2015-09-22: qty 500

## 2015-09-22 MED ORDER — MIDAZOLAM HCL 2 MG/2ML IJ SOLN
INTRAMUSCULAR | Status: AC
  Filled 2015-09-22: qty 2

## 2015-09-22 MED ORDER — NALOXONE HCL 0.4 MG/ML IJ SOLN
0.4000 mg | INTRAMUSCULAR | Status: DC | PRN
  Filled 2015-09-22: qty 1

## 2015-09-22 MED ORDER — PROMETHAZINE HCL 25 MG/ML IJ SOLN
INTRAMUSCULAR | Status: AC
  Filled 2015-09-22: qty 1

## 2015-09-22 MED ORDER — HYDROMORPHONE 1 MG/ML IV SOLN
INTRAVENOUS | Status: DC
  Administered 2015-09-22: 14:00:00 via INTRAVENOUS
  Administered 2015-09-22: 3.3 mg via INTRAVENOUS
  Administered 2015-09-23 (×2): 1 mg via INTRAVENOUS
  Filled 2015-09-22 (×2): qty 25

## 2015-09-22 MED ORDER — MIDAZOLAM HCL 5 MG/5ML IJ SOLN
INTRAMUSCULAR | Status: DC | PRN
  Administered 2015-09-22: 2 mg via INTRAVENOUS

## 2015-09-22 MED ORDER — MENTHOL 3 MG MT LOZG: 1.0000 | LOZENGE | OROMUCOSAL | Status: DC | PRN

## 2015-09-22 MED ORDER — PROPOFOL 10 MG/ML IV BOLUS
INTRAVENOUS | Status: AC
  Filled 2015-09-22: qty 40

## 2015-09-22 MED ORDER — LIDOCAINE HCL (CARDIAC) 20 MG/ML IV SOLN
INTRAVENOUS | Status: AC
  Filled 2015-09-22: qty 5

## 2015-09-22 MED ORDER — GLYCOPYRROLATE 0.2 MG/ML IJ SOLN
INTRAMUSCULAR | Status: DC | PRN
  Administered 2015-09-22: 0.6 mg via INTRAVENOUS

## 2015-09-22 MED ORDER — DEXTROSE IN LACTATED RINGERS 5 % IV SOLN
INTRAVENOUS | Status: DC
Start: 2015-09-22 — End: 2015-09-23
  Administered 2015-09-22 (×2): via INTRAVENOUS
  Administered 2015-09-23: 125 mL/h via INTRAVENOUS

## 2015-09-22 MED ORDER — SODIUM CHLORIDE 0.9 % IR SOLN
Status: DC | PRN
  Administered 2015-09-22: 1000 mL

## 2015-09-22 MED ORDER — DIPHENHYDRAMINE HCL 50 MG/ML IJ SOLN
INTRAMUSCULAR | Status: AC
  Filled 2015-09-22: qty 1

## 2015-09-22 MED ORDER — DEXAMETHASONE SODIUM PHOSPHATE 10 MG/ML IJ SOLN
INTRAMUSCULAR | Status: AC
  Filled 2015-09-22: qty 1

## 2015-09-22 MED ORDER — FENTANYL CITRATE (PF) 250 MCG/5ML IJ SOLN
INTRAMUSCULAR | Status: AC
  Filled 2015-09-22: qty 5

## 2015-09-22 MED ORDER — ROCURONIUM BROMIDE 100 MG/10ML IV SOLN
INTRAVENOUS | Status: AC
  Filled 2015-09-22: qty 1

## 2015-09-22 MED ORDER — LACTATED RINGERS IV SOLN
INTRAVENOUS | Status: DC
  Administered 2015-09-22 (×2): via INTRAVENOUS
  Filled 2015-09-22: qty 1000

## 2015-09-22 MED ORDER — DEXAMETHASONE SODIUM PHOSPHATE 4 MG/ML IJ SOLN
INTRAMUSCULAR | Status: DC | PRN
  Administered 2015-09-22: 10 mg via INTRAVENOUS

## 2015-09-22 MED ORDER — ONDANSETRON HCL 4 MG/2ML IJ SOLN
4.0000 mg | Freq: Four times a day (QID) | INTRAMUSCULAR | Status: DC | PRN
  Administered 2015-09-22: 4 mg via INTRAVENOUS
  Filled 2015-09-22: qty 2

## 2015-09-22 MED ORDER — TRAMADOL HCL 50 MG PO TABS
50.0000 mg | ORAL_TABLET | Freq: Four times a day (QID) | ORAL | Status: DC | PRN
  Administered 2015-09-22: 50 mg via ORAL
  Filled 2015-09-22: qty 1

## 2015-09-22 MED ORDER — HYDROMORPHONE HCL 4 MG/ML IJ SOLN
INTRAMUSCULAR | Status: AC
  Filled 2015-09-22: qty 1

## 2015-09-22 MED ORDER — MEPERIDINE HCL 25 MG/ML IJ SOLN
6.2500 mg | INTRAMUSCULAR | Status: DC | PRN
  Filled 2015-09-22: qty 1

## 2015-09-22 MED ORDER — CEFAZOLIN SODIUM-DEXTROSE 2-3 GM-% IV SOLR
INTRAVENOUS | Status: AC
  Filled 2015-09-22: qty 50

## 2015-09-22 MED ORDER — PROMETHAZINE HCL 25 MG/ML IJ SOLN
6.2500 mg | INTRAMUSCULAR | Status: DC | PRN
  Administered 2015-09-22: 6.25 mg via INTRAVENOUS
  Filled 2015-09-22: qty 1

## 2015-09-22 MED ORDER — ALBUMIN HUMAN 5 % IV SOLN
25.0000 g | Freq: Once | INTRAVENOUS | Status: AC
  Administered 2015-09-22 (×2): via INTRAVENOUS
  Filled 2015-09-22 (×2): qty 500

## 2015-09-22 MED ORDER — ACETAMINOPHEN 10 MG/ML IV SOLN
INTRAVENOUS | Status: DC | PRN
  Administered 2015-09-22: 1000 mg via INTRAVENOUS

## 2015-09-22 MED ORDER — LIDOCAINE HCL (CARDIAC) 20 MG/ML IV SOLN
INTRAVENOUS | Status: AC
  Filled 2015-09-22: qty 10

## 2015-09-22 MED ORDER — ROCURONIUM BROMIDE 100 MG/10ML IV SOLN
INTRAVENOUS | Status: DC | PRN
  Administered 2015-09-22: 10 mg via INTRAVENOUS
  Administered 2015-09-22: 50 mg via INTRAVENOUS
  Administered 2015-09-22: 10 mg via INTRAVENOUS

## 2015-09-22 MED ORDER — PROPOFOL 10 MG/ML IV BOLUS
INTRAVENOUS | Status: DC | PRN
  Administered 2015-09-22: 200 mg via INTRAVENOUS

## 2015-09-22 MED ORDER — LIDOCAINE HCL (CARDIAC) 20 MG/ML IV SOLN
INTRAVENOUS | Status: DC | PRN
  Administered 2015-09-22: 100 mg via INTRAVENOUS

## 2015-09-22 MED ORDER — LACTATED RINGERS IV SOLN
INTRAVENOUS | Status: DC
  Filled 2015-09-22: qty 1000

## 2015-09-22 MED ORDER — NEOSTIGMINE METHYLSULFATE 10 MG/10ML IV SOLN
INTRAVENOUS | Status: AC
  Filled 2015-09-22: qty 1

## 2015-09-22 MED ORDER — PHENYLEPHRINE HCL 10 MG/ML IJ SOLN
INTRAMUSCULAR | Status: AC
  Filled 2015-09-22: qty 1

## 2015-09-22 MED ORDER — SUCCINYLCHOLINE CHLORIDE 20 MG/ML IJ SOLN
INTRAMUSCULAR | Status: DC | PRN
  Administered 2015-09-22: 100 mg via INTRAVENOUS

## 2015-09-22 MED ORDER — ACETAMINOPHEN 10 MG/ML IV SOLN
INTRAVENOUS | Status: AC
  Filled 2015-09-22: qty 100

## 2015-09-22 MED ORDER — SODIUM CHLORIDE 0.9 % IJ SOLN
9.0000 mL | INTRAMUSCULAR | Status: DC | PRN
  Filled 2015-09-22: qty 9

## 2015-09-22 MED ORDER — IBUPROFEN 200 MG PO TABS
600.0000 mg | ORAL_TABLET | Freq: Four times a day (QID) | ORAL | Status: DC | PRN
  Administered 2015-09-22 – 2015-09-23 (×2): 600 mg via ORAL
  Filled 2015-09-22 (×2): qty 3

## 2015-09-22 MED ORDER — ONDANSETRON HCL 4 MG/2ML IJ SOLN
INTRAMUSCULAR | Status: AC
  Filled 2015-09-22: qty 2

## 2015-09-22 MED ORDER — ARTIFICIAL TEARS OP OINT
TOPICAL_OINTMENT | OPHTHALMIC | Status: AC
  Filled 2015-09-22: qty 3.5

## 2015-09-22 MED ORDER — 0.9 % SODIUM CHLORIDE (POUR BTL) OPTIME
TOPICAL | Status: DC | PRN
  Administered 2015-09-22: 500 mL

## 2015-09-22 MED ORDER — NEOSTIGMINE METHYLSULFATE 10 MG/10ML IV SOLN
INTRAVENOUS | Status: DC | PRN
  Administered 2015-09-22: 4 mg via INTRAVENOUS

## 2015-09-22 MED ORDER — FENTANYL CITRATE (PF) 100 MCG/2ML IJ SOLN
INTRAMUSCULAR | Status: DC | PRN
Start: 2015-09-22 — End: 2015-09-22
  Administered 2015-09-22: 50 ug via INTRAVENOUS
  Administered 2015-09-22: 100 ug via INTRAVENOUS
  Administered 2015-09-22 (×2): 50 ug via INTRAVENOUS

## 2015-09-22 MED ORDER — DIPHENHYDRAMINE HCL 50 MG/ML IJ SOLN
12.5000 mg | Freq: Four times a day (QID) | INTRAMUSCULAR | Status: DC | PRN
  Filled 2015-09-22: qty 0.25

## 2015-09-22 MED ORDER — GLYCOPYRROLATE 0.2 MG/ML IJ SOLN
INTRAMUSCULAR | Status: AC
  Filled 2015-09-22: qty 3

## 2015-09-22 SURGICAL SUPPLY — 81 items
APPLICATOR COTTON TIP 6IN STRL (MISCELLANEOUS) ×2 IMPLANT
ARISTA HEMOSTATIC 3G ×1 IMPLANT
BAG SPEC RTRVL LRG 6X4 10 (ENDOMECHANICALS)
BAG URINE DRAINAGE (UROLOGICAL SUPPLIES) ×2 IMPLANT
BANDAGE ADH SHEER 1  50/CT (GAUZE/BANDAGES/DRESSINGS) ×1 IMPLANT
BANDAGE ADHESIVE 1X3 (GAUZE/BANDAGES/DRESSINGS) IMPLANT
BARRIER ADHS 3X4 INTERCEED (GAUZE/BANDAGES/DRESSINGS) IMPLANT
BLADE CLIPPER SURG (BLADE) ×2 IMPLANT
BLADE SURG 11 STRL SS (BLADE) ×2 IMPLANT
BRR ADH 4X3 ABS CNTRL BYND (GAUZE/BANDAGES/DRESSINGS)
CANISTER SUCTION 2500CC (MISCELLANEOUS) IMPLANT
CATH FOLEY 2WAY SLVR  5CC 14FR (CATHETERS) ×1
CATH FOLEY 2WAY SLVR 5CC 14FR (CATHETERS) ×1 IMPLANT
CHLORAPREP W/TINT 26ML (MISCELLANEOUS) ×2 IMPLANT
COVER BACK TABLE 60X90IN (DRAPES) ×4 IMPLANT
COVER LIGHT HANDLE  1/PK (MISCELLANEOUS) ×2
COVER LIGHT HANDLE 1/PK (MISCELLANEOUS) IMPLANT
DRAPE LG THREE QUARTER DISP (DRAPES) ×2 IMPLANT
DRAPE UNDERBUTTOCKS STRL (DRAPE) ×2 IMPLANT
DRSG OPSITE POSTOP 3X4 (GAUZE/BANDAGES/DRESSINGS) ×1 IMPLANT
DRSG TEGADERM 4X4.75 (GAUZE/BANDAGES/DRESSINGS) ×1 IMPLANT
ELECT REM PT RETURN 9FT ADLT (ELECTROSURGICAL) ×2
ELECTRODE REM PT RTRN 9FT ADLT (ELECTROSURGICAL) ×1 IMPLANT
FILTER SMOKE EVAC LAPAROSHD (FILTER) IMPLANT
GLOVE BIO SURGEON STRL SZ 6.5 (GLOVE) ×6 IMPLANT
GLOVE BIO SURGEON STRL SZ7 (GLOVE) ×2 IMPLANT
GOWN STRL REUS W/ TWL LRG LVL3 (GOWN DISPOSABLE) ×1 IMPLANT
GOWN STRL REUS W/TWL LRG LVL3 (GOWN DISPOSABLE) ×2
HOLDER FOLEY CATH W/STRAP (MISCELLANEOUS) ×2 IMPLANT
IV NS 1000ML (IV SOLUTION) ×2
IV NS 1000ML BAXH (IV SOLUTION) IMPLANT
KIT ROOM TURNOVER WOR (KITS) ×2 IMPLANT
LIQUID BAND (GAUZE/BANDAGES/DRESSINGS) IMPLANT
NDL HYPO 25X1 1.5 SAFETY (NEEDLE) ×1 IMPLANT
NDL INSUFFLATION 14GA 120MM (NEEDLE) IMPLANT
NDL INSUFFLATION 14GA 150MM (NEEDLE) IMPLANT
NDL SPNL 22GX3.5 QUINCKE BK (NEEDLE) ×1 IMPLANT
NEEDLE HYPO 25X1 1.5 SAFETY (NEEDLE) ×2 IMPLANT
NEEDLE INSUFFLATION 14GA 120MM (NEEDLE) IMPLANT
NEEDLE INSUFFLATION 14GA 150MM (NEEDLE) ×2 IMPLANT
NEEDLE SPNL 22GX3.5 QUINCKE BK (NEEDLE) ×2 IMPLANT
NS IRRIG 500ML POUR BTL (IV SOLUTION) ×2 IMPLANT
PACK BASIN DAY SURGERY FS (CUSTOM PROCEDURE TRAY) ×2 IMPLANT
PAD OB MATERNITY 4.3X12.25 (PERSONAL CARE ITEMS) ×2 IMPLANT
PAD PREP 24X48 CUFFED NSTRL (MISCELLANEOUS) ×2 IMPLANT
PADDING ION DISPOSABLE (MISCELLANEOUS) ×2 IMPLANT
PENCIL BUTTON HOLSTER BLD 10FT (ELECTRODE) ×2 IMPLANT
POUCH SPECIMEN RETRIEVAL 10MM (ENDOMECHANICALS) IMPLANT
SCISSORS LAP 5X35 DISP (ENDOMECHANICALS) IMPLANT
SEALER TISSUE G2 CVD JAW 45CM (ENDOMECHANICALS) ×2 IMPLANT
SET IRRIG TUBING LAPAROSCOPIC (IRRIGATION / IRRIGATOR) ×2 IMPLANT
SHEET LAVH (DRAPES) ×2 IMPLANT
SOLUTION ANTI FOG 6CC (MISCELLANEOUS) ×2 IMPLANT
SOLUTION ELECTROLUBE (MISCELLANEOUS) ×2 IMPLANT
SPONGE LAP 4X18 X RAY DECT (DISPOSABLE) ×2 IMPLANT
STRIP CLOSURE SKIN 1/4X4 (GAUZE/BANDAGES/DRESSINGS) IMPLANT
SUT VIC AB 0 CT1 18XCR BRD 8 (SUTURE) ×2 IMPLANT
SUT VIC AB 0 CT1 18XCR BRD8 (SUTURE) IMPLANT
SUT VIC AB 0 CT1 36 (SUTURE) ×6 IMPLANT
SUT VIC AB 0 CT1 8-18 (SUTURE) ×4
SUT VIC AB 3-0 PS2 18 (SUTURE) ×4
SUT VIC AB 3-0 PS2 18XBRD (SUTURE) ×1 IMPLANT
SUT VIC AB 3-0 SH 27 (SUTURE)
SUT VIC AB 3-0 SH 27X BRD (SUTURE) IMPLANT
SUT VICRYL 0 TIES 12 18 (SUTURE) ×2 IMPLANT
SUT VICRYL 0 UR6 27IN ABS (SUTURE) ×2 IMPLANT
SYR 3ML 23GX1 SAFETY (SYRINGE) IMPLANT
SYR BULB IRRIGATION 50ML (SYRINGE) ×2 IMPLANT
SYR CONTROL 10ML LL (SYRINGE) ×2 IMPLANT
SYRINGE 10CC LL (SYRINGE) ×2 IMPLANT
TOWEL NATURAL 6PK STERILE (DISPOSABLE) ×4 IMPLANT
TOWEL OR 17X24 6PK STRL BLUE (TOWEL DISPOSABLE) ×4 IMPLANT
TRAY DSU PREP LF (CUSTOM PROCEDURE TRAY) ×2 IMPLANT
TROCAR OPTI TIP 5M 100M (ENDOMECHANICALS) ×2 IMPLANT
TROCAR XCEL BLUNT TIP 100MML (ENDOMECHANICALS) IMPLANT
TROCAR XCEL DIL TIP R 11M (ENDOMECHANICALS) ×2 IMPLANT
TROCAR XCEL NON-BLD 11X100MML (ENDOMECHANICALS) IMPLANT
TUBE CONNECTING 12X1/4 (SUCTIONS) ×4 IMPLANT
TUBING INSUFFLATION 10FT LAP (TUBING) ×2 IMPLANT
WATER STERILE IRR 500ML POUR (IV SOLUTION) ×2 IMPLANT
YANKAUER SUCT BULB TIP NO VENT (SUCTIONS) ×2 IMPLANT

## 2015-09-22 NOTE — Anesthesia Preprocedure Evaluation (Addendum)
Anesthesia Evaluation  Patient identified by MRN, date of birth, ID band Patient awake    Reviewed: Allergy & Precautions, NPO status , Patient's Chart, lab work & pertinent test results  History of Anesthesia Complications (+) PONV  Airway Mallampati: III  TM Distance: >3 FB Neck ROM: Limited    Dental no notable dental hx. (+) Teeth Intact, Dental Advisory Given   Pulmonary neg pulmonary ROS,    Pulmonary exam normal breath sounds clear to auscultation       Cardiovascular Exercise Tolerance: Good negative cardio ROS Normal cardiovascular exam Rhythm:Regular Rate:Normal     Neuro/Psych Anxiety Depression S/P ACDF C4-7 negative neurological ROS     GI/Hepatic negative GI ROS, Neg liver ROS, GERD  Medicated and Controlled,  Endo/Other  negative endocrine ROS  Renal/GU negative Renal ROS  negative genitourinary   Musculoskeletal negative musculoskeletal ROS (+) Arthritis , Osteoarthritis,    Abdominal   Peds negative pediatric ROS (+)  Hematology negative hematology ROS (+)   Anesthesia Other Findings S/P ACDF C4-7 limited neck mobility  Reproductive/Obstetrics negative OB ROS                             Anesthesia Physical  Anesthesia Plan  ASA: II  Anesthesia Plan: General   Post-op Pain Management:    Induction: Intravenous, Rapid sequence and Cricoid pressure planned  Airway Management Planned: Oral ETT and Video Laryngoscope Planned  Additional Equipment:   Intra-op Plan:   Post-operative Plan: Extubation in OR  Informed Consent: I have reviewed the patients History and Physical, chart, labs and discussed the procedure including the risks, benefits and alternatives for the proposed anesthesia with the patient or authorized representative who has indicated his/her understanding and acceptance.   Dental advisory given  Plan Discussed with: CRNA  Anesthesia Plan  Comments: (Previous success with glidescope)        Anesthesia Quick Evaluation

## 2015-09-22 NOTE — Progress Notes (Signed)
Pt received 1 unit PRBC in recovery, completed in pt's room at 1420. Pt tolerated transfusion well with no side effects. See vital signs.

## 2015-09-22 NOTE — Progress Notes (Signed)
Patient is doing well but has been experiencing heavy vaginal bleeding  Hemoglobin is 8.1  Discussed with patient the possible need for blood transfusion - risks reviewed   Anticipate transfusion this afternoon  H and P on chart  Consent signed

## 2015-09-22 NOTE — Brief Op Note (Signed)
09/22/2015  9:45 AM  PATIENT:  [name]  44 y.o. female  PRE-OPERATIVE DIAGNOSIS:  Fibroids, Menorrhagia   POST-OPERATIVE DIAGNOSIS:  Same  PROCEDURE:  Procedure(s): LAPAROSCOPIC ASSISTED VAGINAL HYSTERECTOMY WITH BILATERAL SALPINGECTOMY (Bilateral)  SURGEON:  Surgeon(s) and Role:    * Dian Queen, MD - Primary    * Marylynn Pearson, MD - Assisting  PHYSICIAN ASSISTANT:   ASSISTANTS: none   ANESTHESIA:   general  EBL:  Total I/O In: 1250 [I.V.:1000; IV Piggyback:250] Out: 450 [Urine:100; Blood:350]  BLOOD ADMINISTERED:none  DRAINS: Urinary Catheter (Foley)   LOCAL MEDICATIONS USED:  NONE  SPECIMEN:  Source of Specimen:  uterus cervix and fallopian tubes  DISPOSITION OF SPECIMEN:  PATHOLOGY  COUNTS:  YES  TOURNIQUET:  * No tourniquets in log *  DICTATION: .Other Dictation: Dictation Number [id number]  PLAN OF CARE: Admit for overnight observation  PATIENT DISPOSITION:  PACU - hemodynamically stable.   Delay start of Pharmacological VTE agent (>24hrs) due to surgical blood loss or risk of bleeding: not applicable

## 2015-09-22 NOTE — Transfer of Care (Signed)
Immediate Anesthesia Transfer of Care Note  Patient: [name]  Procedure(s) Performed: Procedure(s): LAPAROSCOPIC ASSISTED VAGINAL HYSTERECTOMY WITH BILATERAL SALPINGECTOMY (Bilateral)  Patient Location: PACU  Anesthesia Type:General  Level of Consciousness: awake, alert , oriented and patient cooperative  Airway & Oxygen Therapy: Patient Spontanous Breathing and Patient connected to nasal cannula oxygen  Post-op Assessment: Report given to RN and Post -op Vital signs reviewed and stable  Post vital signs: Reviewed and stable  Last Vitals:  Filed Vitals:   09/22/15 0626  BP: 156/69  Pulse: 105  Temp: 37.1 C  Resp: 18    Complications: No apparent anesthesia complications

## 2015-09-22 NOTE — Anesthesia Procedure Notes (Signed)
Procedure Name: Intubation Date/Time: 09/22/2015 8:02 AM Performed by: Wanita Chamberlain Pre-anesthesia Checklist: Patient identified, Emergency Drugs available, Suction available, Patient being monitored and Timeout performed Patient Re-evaluated:Patient Re-evaluated prior to inductionOxygen Delivery Method: Circle system utilized Preoxygenation: Pre-oxygenation with 100% oxygen Intubation Type: IV induction Ventilation: Mask ventilation without difficulty Grade View: Grade I Tube type: Parker flex tip Tube size: 7.0 mm Number of attempts: 1 Airway Equipment and Method: Rigid stylet,  Bite block and Video-laryngoscopy Placement Confirmation: ETT inserted through vocal cords under direct vision,  positive ETCO2 and breath sounds checked- equal and bilateral Secured at: 22 cm Tube secured with: Tape Dental Injury: Teeth and Oropharynx as per pre-operative assessment  Difficulty Due To: Difficulty was anticipated, Difficult Airway- due to large tongue and Difficult Airway- due to limited oral opening Comments: S/P ACDF C4-7

## 2015-09-22 NOTE — Anesthesia Postprocedure Evaluation (Signed)
Anesthesia Post Note  Patient: [name]  Procedure(s) Performed: Procedure(s) (LRB): LAPAROSCOPIC ASSISTED VAGINAL HYSTERECTOMY WITH BILATERAL SALPINGECTOMY (Bilateral)  Patient location during evaluation: PACU Anesthesia Type: General Level of consciousness: awake and alert Pain management: pain level controlled Vital Signs Assessment: post-procedure vital signs reviewed and stable Respiratory status: spontaneous breathing, nonlabored ventilation, respiratory function stable and patient connected to nasal cannula oxygen Cardiovascular status: blood pressure returned to baseline and stable Postop Assessment: no signs of nausea or vomiting Anesthetic complications: no    Last Vitals:  Filed Vitals:   09/22/15 1215 09/22/15 1230  BP: 141/67 148/67  Pulse: 99 104  Temp:    Resp: 14 17    Last Pain:  Filed Vitals:   09/22/15 1255  PainSc: 5                  Montez Hageman

## 2015-09-23 ENCOUNTER — Encounter (HOSPITAL_BASED_OUTPATIENT_CLINIC_OR_DEPARTMENT_OTHER): Payer: Self-pay | Admitting: Obstetrics and Gynecology

## 2015-09-23 DIAGNOSIS — D259 Leiomyoma of uterus, unspecified: Secondary | ICD-10-CM | POA: Diagnosis not present

## 2015-09-23 LAB — BASIC METABOLIC PANEL
Anion gap: 7 (ref 5–15)
BUN: 6 mg/dL (ref 6–20)
CO2: 27 mmol/L (ref 22–32)
CREATININE: 0.64 mg/dL (ref 0.44–1.00)
Calcium: 8.5 mg/dL — ABNORMAL LOW (ref 8.9–10.3)
Chloride: 107 mmol/L (ref 101–111)
GFR calc Af Amer: >60 mL/min (ref >60)
GLUCOSE: 124 mg/dL — AB (ref 65–99)
POTASSIUM: 3.5 mmol/L (ref 3.5–5.1)
SODIUM: 141 mmol/L (ref 135–145)

## 2015-09-23 LAB — TYPE AND SCREEN
ABO/RH(D): A POS
Antibody Screen: NEGATIVE
Unit division: 0

## 2015-09-23 LAB — CBC
HEMATOCRIT: 25.3 % — AB (ref 36.0–46.0)
Hemoglobin: 7.5 g/dL — ABNORMAL LOW (ref 12.0–15.0)
MCH: 22.2 pg — ABNORMAL LOW (ref 26.0–34.0)
MCHC: 29.6 g/dL — AB (ref 30.0–36.0)
MCV: 74.9 fL — AB (ref 78.0–100.0)
PLATELETS: 338 10*3/uL (ref 150–400)
RBC: 3.38 MIL/uL — ABNORMAL LOW (ref 3.87–5.11)
RDW: 16.4 % — AB (ref 11.5–15.5)
WBC: 9.2 10*3/uL (ref 4.0–10.5)

## 2015-09-23 MED ORDER — OXYCODONE-ACETAMINOPHEN 10-325 MG PO TABS: 1.0000 | ORAL_TABLET | ORAL | Status: AC | PRN

## 2015-09-23 MED ORDER — IBUPROFEN 600 MG PO TABS: 600.0000 mg | ORAL_TABLET | Freq: Four times a day (QID) | ORAL | Status: AC | PRN

## 2015-09-23 MED ORDER — SODIUM CHLORIDE 0.9 % IV SOLN
510.0000 mg | Freq: Once | INTRAVENOUS | Status: AC
  Administered 2015-09-23: 510 mg via INTRAVENOUS
  Filled 2015-09-23: qty 17

## 2015-09-23 NOTE — Op Note (Signed)
[name], [name] NO.:  [id number]  MEDICAL RECORD NO.:  [id number]  LOCATION:  [id number]                         FACILITY:  [facility]  PHYSICIAN:  Winlock Ilean Spradlin, M.D.DATE OF BIRTH:  01-Jun-1971  DATE OF PROCEDURE:  09/22/2015 DATE OF DISCHARGE:                              OPERATIVE REPORT   PREOPERATIVE DIAGNOSES:  Fibroids and menorrhagia and anemia.  POSTOPERATIVE DIAGNOSES:  Fibroids and menorrhagia and anemia.  PROCEDURE:  Laparoscopic-assisted vaginal hysterectomy and bilateral salpingectomy.  SURGEON:  [name], M.D.  ASSISTANT:  Marylynn Pearson, M.D.  ANESTHESIA:  General.  EBL:  350 mL.  COMPLICATIONS:  None.  DRAINS:  Foley catheter.  DESCRIPTION OF PROCEDURE:  The patient was taken to the operating room. She was consented about the risks of the procedure.  She was counseled prior to the procedure that because of her hemoglobin of 8.1, she most likely would need to have a blood transfusion during or after the surgery because she is currently symptomatic.  The patient was intubated.  She was prepped and draped.  A Foley catheter was inserted. The uterine manipulator was inserted.  Attention was turned to the abdomen where a small infraumbilical incision was made and a Veress needle was inserted.  The pneumoperitoneum was performed.  An 11-mm trocar was inserted.  The laparoscope was introduced into the trocar sheath, and no area of bleeding or intestinal injury was noted.  The patient was placed in Trendelenburg position.  A 5-mm trocar was inserted under direct visualization.  The uterus was enlarged and was noted to be myomatous.  She was status post tubal ligation.  Her ovaries were normal.  We used the EnSeal instrument and placed across the mesosalpinx on the right, carried that down by careful electrocautery and then cutting and then we were able to place the EnSeal across the triple pedicle and the round ligament.  This was  done on the right side and then done on the left side in similar fashion.  The hemostasis was excellent.  After we had mobilized the tubes and loosened the round ligaments and free the ovaries from the uterus, we then went down to the vagina, placed a weighted speculum in the vagina, made a circumferential incision around the cervix and entered into the posterior cul-de-sac and anterior cul-de-sac sharply using Mayo scissors.  We used curved Heaney clamps, clamped the uterosacral cardinal ligaments on either side.  Each pedicle was clamped, cut, and suture ligated using 0 Vicryl suture.  We carefully walked up the broad ligament.  Each pedicle was clamped, cut, and suture ligated using 0 Vicryl suture.  We then removed the uterus and identified the cervix, uterus, and fallopian tubes, and was sent to Pathology.  The angle stitches were placed on either side at 3 and 9 o'clock using 0 Vicryl sutures.  The posterior cuff was closed completely using 0 Vicryl suture and the cuff was then closed anterior to posterior using 0 Vicryl suture.  We went back up to the abdomen, used Nezhat and performed suction irrigation.  There was no area of bleeding, but she did appear to have a raw surface of the cuff.  There was no  oozing noted and I did observe under low pressure as well.  I then used Arista as a hemostatic agent and placed that across the surgical site and there was no bleeding through that at all.  At the end of the procedure, the pneumoperitoneum was released.  The trocars were removed.  The incisions were closed with 0 Vicryl interrupted.  After pressure dressing was applied to each side, all sponge, lap, and instrument counts were correct x2.  Because of the EBL during the surgery and because of the patient's preoperative hemoglobin, I am going to give her 1 unit of packed red blood cells in the recovery room and she will stay overnight in the hospital.     [name] L. Helane Roberson,  M.D.     Nevin Bloodgood  D:  09/22/2015  T:  09/23/2015  Job:  [id number]

## 2015-09-23 NOTE — Discharge Summary (Signed)
  Admission Diagnosis: Menorrhagia Fibroids Anemia  Discharge Diagnosis: Same  Hospital Course:  44 year old with menorrhagia. She underwent LAVH and bilateral salpingectomy. She had a preop hemoglobin of 8.1 and because of that received 1 unit of packed RBCs in recovery. She is doing very well. On POD #1 her hemoglobin was 7.5 and her pain was 2 / 10. She had stable vital signs.  She went home after voiding, ambulating and tolerating regular diet. She had 1 dose of Feraheme on POD #1.  BP 120/62 mmHg  Pulse 70  Temp(Src) 98.2 F (36.8 C) (Oral)  Resp 18  Ht 5' 7.5" (1.715 m)  Wt 230 lb (104.327 kg)  BMI 35.47 kg/m2  SpO2 98%  LMP 09/05/2015 (Exact Date) Abdomen is soft and non tender Incision clean dry and intact  Results for orders placed or performed during the hospital encounter of 09/22/15 (from the past 24 hour(s))  Prepare RBC     Status: None   Collection Time: 09/22/15 10:30 AM  Result Value Ref Range   Order Confirmation ORDER PROCESSED BY BLOOD BANK   CBC     Status: Abnormal   Collection Time: 09/22/15 10:53 AM  Result Value Ref Range   WBC 11.0 (H) 4.0 - 10.5 K/uL   RBC 3.28 (L) 3.87 - 5.11 MIL/uL   Hemoglobin 7.1 (L) 12.0 - 15.0 g/dL   HCT 24.3 (L) 36.0 - 46.0 %   MCV 74.1 (L) 78.0 - 100.0 fL   MCH 21.6 (L) 26.0 - 34.0 pg   MCHC 29.2 (L) 30.0 - 36.0 g/dL   RDW 16.5 (H) 11.5 - 15.5 %   Platelets 399 150 - 400 K/uL  Basic metabolic panel     Status: Abnormal   Collection Time: 09/23/15  4:16 AM  Result Value Ref Range   Sodium 141 135 - 145 mmol/L   Potassium 3.5 3.5 - 5.1 mmol/L   Chloride 107 101 - 111 mmol/L   CO2 27 22 - 32 mmol/L   Glucose, Bld 124 (H) 65 - 99 mg/dL   BUN 6 6 - 20 mg/dL   Creatinine, Ser 0.64 0.44 - 1.00 mg/dL   Calcium 8.5 (L) 8.9 - 10.3 mg/dL   GFR calc non Af Amer >60 >60 mL/min   GFR calc Af Amer >60 >60 mL/min   Anion gap 7 5 - 15  CBC     Status: Abnormal   Collection Time: 09/23/15  4:16 AM  Result Value Ref Range   WBC 9.2 4.0 - 10.5 K/uL   RBC 3.38 (L) 3.87 - 5.11 MIL/uL   Hemoglobin 7.5 (L) 12.0 - 15.0 g/dL   HCT 25.3 (L) 36.0 - 46.0 %   MCV 74.9 (L) 78.0 - 100.0 fL   MCH 22.2 (L) 26.0 - 34.0 pg   MCHC 29.6 (L) 30.0 - 36.0 g/dL   RDW 16.4 (H) 11.5 - 15.5 %   Platelets 338 150 - 400 K/uL   She was discharged home in good condition Discharge medications Ibuprofen and percocet Follow up in 1 week No driving for 1 week No intercourse for 6 weeks Call for fever, chills, nausea and vomiting and severe abdominal pain.

## 2016-07-14 ENCOUNTER — Ambulatory Visit (INDEPENDENT_AMBULATORY_CARE_PROVIDER_SITE_OTHER): Payer: Commercial Managed Care - HMO

## 2016-07-14 DIAGNOSIS — Z23 Encounter for immunization: Secondary | ICD-10-CM

## 2016-07-26 ENCOUNTER — Other Ambulatory Visit: Payer: Self-pay

## 2016-09-12 ENCOUNTER — Other Ambulatory Visit: Payer: Self-pay | Admitting: Physician Assistant

## 2016-10-12 ENCOUNTER — Other Ambulatory Visit: Payer: Self-pay | Admitting: Physician Assistant

## 2016-10-15 NOTE — Telephone Encounter (Signed)
Route to pool for call in

## 2016-10-24 ENCOUNTER — Other Ambulatory Visit: Payer: Self-pay | Admitting: Physician Assistant

## 2016-12-05 ENCOUNTER — Other Ambulatory Visit: Payer: Self-pay | Admitting: Physician Assistant

## 2017-01-18 ENCOUNTER — Other Ambulatory Visit: Payer: Self-pay | Admitting: Physician Assistant

## 2017-01-21 NOTE — Telephone Encounter (Signed)
Sees Dr. Melina Copa

## 2017-02-02 ENCOUNTER — Other Ambulatory Visit: Payer: Self-pay | Admitting: Physician Assistant

## 2017-03-27 ENCOUNTER — Other Ambulatory Visit: Payer: Self-pay | Admitting: Orthopedic Surgery

## 2017-03-27 DIAGNOSIS — M5442 Lumbago with sciatica, left side: Principal | ICD-10-CM

## 2017-03-27 DIAGNOSIS — G8929 Other chronic pain: Secondary | ICD-10-CM

## 2017-03-28 ENCOUNTER — Other Ambulatory Visit: Payer: Self-pay | Admitting: Orthopedic Surgery

## 2017-03-28 DIAGNOSIS — M5442 Lumbago with sciatica, left side: Principal | ICD-10-CM

## 2017-03-28 DIAGNOSIS — G8929 Other chronic pain: Secondary | ICD-10-CM

## 2017-04-02 ENCOUNTER — Ambulatory Visit
Admission: RE | Admit: 2017-04-02 | Discharge: 2017-04-02 | Disposition: A | Discharge status: Home / Self Care | Payer: 59 | Source: Ambulatory Visit | Attending: Orthopedic Surgery | Admitting: Orthopedic Surgery

## 2017-04-02 DIAGNOSIS — G8929 Other chronic pain: Secondary | ICD-10-CM

## 2017-04-02 DIAGNOSIS — M5442 Lumbago with sciatica, left side: Principal | ICD-10-CM

## 2017-04-02 NOTE — Discharge Instructions (Signed)

## 2017-05-02 ENCOUNTER — Other Ambulatory Visit: Payer: Self-pay | Admitting: Physician Assistant

## 2017-08-07 ENCOUNTER — Other Ambulatory Visit: Payer: Self-pay | Admitting: Orthopedic Surgery

## 2017-08-07 DIAGNOSIS — M533 Sacrococcygeal disorders, not elsewhere classified: Secondary | ICD-10-CM

## 2017-08-15 ENCOUNTER — Ambulatory Visit
Admission: RE | Admit: 2017-08-15 | Discharge: 2017-08-15 | Disposition: A | Discharge status: Home / Self Care | Payer: 59 | Source: Ambulatory Visit | Attending: Orthopedic Surgery | Admitting: Orthopedic Surgery

## 2017-08-15 DIAGNOSIS — M533 Sacrococcygeal disorders, not elsewhere classified: Secondary | ICD-10-CM

## 2017-08-15 MED ORDER — METHYLPREDNISOLONE ACETATE 40 MG/ML INJ SUSP (RADIOLOG: 120.0000 mg | Freq: Once | INTRAMUSCULAR | Status: DC

## 2020-03-02 ENCOUNTER — Ambulatory Visit: Payer: 59 | Attending: Internal Medicine

## 2020-03-02 DIAGNOSIS — Z23 Encounter for immunization: Secondary | ICD-10-CM

## 2020-03-02 NOTE — Progress Notes (Signed)
   Covid-19 Vaccination Clinic  Name:  [name]    MRN: [id number] DOB: [birth year]  03/02/2020  [name] was observed post Covid-19 immunization for 15 minutes without incident. She was provided with Vaccine Information Sheet and instruction to access the V-Safe system.   [name] was instructed to call 911 with any severe reactions post vaccine: Marland Kitchen Difficulty breathing  . Swelling of face and throat  . A fast heartbeat  . A bad rash all over body  . Dizziness and weakness   Immunizations Administered    Name Date Dose VIS Date Route   Pfizer COVID-19 Vaccine 03/02/2020  1:56 PM 0.3 mL 12/09/2018 Intramuscular   Manufacturer: [place]   Lot: [id number]   [place]: [id number]

## 2020-03-23 ENCOUNTER — Ambulatory Visit: Payer: 59 | Attending: Internal Medicine

## 2020-03-23 DIAGNOSIS — Z23 Encounter for immunization: Secondary | ICD-10-CM

## 2020-03-23 NOTE — Progress Notes (Signed)
   Covid-19 Vaccination Clinic  Name:  Kerryann Allaire    MRN: [id number] DOB: 08-14-1971  03/23/2020  Ms. Coggeshall was observed post Covid-19 immunization for 15 minutes without incident. She was provided with Vaccine Information Sheet and instruction to access the V-Safe system.   Ms. Heick was instructed to call 911 with any severe reactions post vaccine: Marland Kitchen Difficulty breathing  . Swelling of face and throat  . A fast heartbeat  . A bad rash all over body  . Dizziness and weakness   Immunizations Administered    Name Date Dose VIS Date Route   Pfizer COVID-19 Vaccine 03/23/2020 10:59 AM 0.3 mL 12/09/2018 Intramuscular   Manufacturer: [place]   Lot: [id number]   [place]: [facility]  Name:  Jeanetta Alonzo    MRN: [id number] DOB: 05-27-1971  03/23/2020  Ms. Rena was observed post Covid-19 immunization for 15 minutes without incident. She was provided with Vaccine Information Sheet and instruction to access the V-Safe system.   Ms. Litz was instructed to call 911 with any severe reactions post vaccine: Marland Kitchen Difficulty breathing  . Swelling of face and throat  . A fast heartbeat  . A bad rash all over body  . Dizziness and weakness   Immunizations Administered    Name Date Dose VIS Date Route   Pfizer COVID-19 Vaccine 03/23/2020 10:59 AM 0.3 mL 12/09/2018 Intramuscular   Manufacturer: [employer]   Lot: [id number]   [place]: [id number]

## 2020-10-06 ENCOUNTER — Ambulatory Visit: Payer: 59 | Attending: Internal Medicine

## 2020-10-06 DIAGNOSIS — Z23 Encounter for immunization: Secondary | ICD-10-CM

## 2020-10-06 NOTE — Progress Notes (Signed)
   Covid-19 Vaccination Clinic  Name:  [name]    MRN: [id number] DOB: November 20, 1970  10/06/2020  [name] was observed post Covid-19 immunization for 15 minutes without incident. She was provided with Vaccine Information Sheet and instruction to access the V-Safe system.   [name] was instructed to call 911 with any severe reactions post vaccine: Marland Kitchen Difficulty breathing  . Swelling of face and throat  . A fast heartbeat  . A bad rash all over body  . Dizziness and weakness   Immunizations Administered    Name Date Dose VIS Date Route   Pfizer COVID-19 Vaccine 10/06/2020  1:55 PM 0.3 mL 08/03/2020 Intramuscular   Manufacturer: [place]   Lot: [id number]   [place]: [id number]
